# Patient Record
Sex: Female | Born: 1975
Health system: Southern US, Community
[De-identification: ages and names within clinical notes are randomized; demographics above are authoritative.]

## PROBLEM LIST (undated history)

## (undated) DIAGNOSIS — E559 Vitamin D deficiency, unspecified: Secondary | ICD-10-CM

## (undated) DIAGNOSIS — F419 Anxiety disorder, unspecified: Secondary | ICD-10-CM

## (undated) HISTORY — DX: Vitamin D deficiency, unspecified: E55.9

## (undated) HISTORY — PX: WISDOM TOOTH EXTRACTION: SHX21

---

## 2001-10-01 ENCOUNTER — Other Ambulatory Visit: Admission: RE | Admit: 2001-10-01 | Discharge: 2001-10-01 | Payer: Self-pay | Admitting: Obstetrics and Gynecology

## 2001-10-23 ENCOUNTER — Other Ambulatory Visit: Admission: RE | Admit: 2001-10-23 | Discharge: 2001-10-23 | Payer: Self-pay | Admitting: Obstetrics and Gynecology

## 2011-03-16 ENCOUNTER — Ambulatory Visit: Payer: Self-pay | Admitting: Internal Medicine

## 2016-07-13 ENCOUNTER — Encounter: Payer: Self-pay | Admitting: Physician Assistant

## 2016-12-28 ENCOUNTER — Encounter: Payer: Self-pay | Admitting: Physician Assistant

## 2016-12-28 ENCOUNTER — Ambulatory Visit (INDEPENDENT_AMBULATORY_CARE_PROVIDER_SITE_OTHER): Payer: BLUE CROSS/BLUE SHIELD | Admitting: Physician Assistant

## 2016-12-28 VITALS — BP 125/87 | HR 88 | Temp 98.1°F | Ht 65.0 in | Wt 157.6 lb

## 2016-12-28 DIAGNOSIS — R6889 Other general symptoms and signs: Secondary | ICD-10-CM | POA: Diagnosis not present

## 2016-12-28 DIAGNOSIS — L659 Nonscarring hair loss, unspecified: Secondary | ICD-10-CM

## 2016-12-28 DIAGNOSIS — F411 Generalized anxiety disorder: Secondary | ICD-10-CM | POA: Diagnosis not present

## 2016-12-28 DIAGNOSIS — R05 Cough: Secondary | ICD-10-CM | POA: Diagnosis not present

## 2016-12-28 DIAGNOSIS — R059 Cough, unspecified: Secondary | ICD-10-CM

## 2016-12-28 DIAGNOSIS — R5383 Other fatigue: Secondary | ICD-10-CM | POA: Diagnosis not present

## 2016-12-28 MED ORDER — AZITHROMYCIN 250 MG PO TABS: ORAL_TABLET | ORAL | 0 refills | Status: DC

## 2016-12-28 MED ORDER — LORAZEPAM 0.5 MG PO TABS: 0.5000 mg | ORAL_TABLET | Freq: Two times a day (BID) | ORAL | 2 refills | Status: DC

## 2016-12-28 NOTE — Patient Instructions (Signed)
Generalized Anxiety Disorder Generalized anxiety disorder (GAD) is a mental disorder. It interferes with life functions, including relationships, work, and school. GAD is different from normal anxiety, which everyone experiences at some point in their lives in response to specific life events and activities. Normal anxiety actually helps us prepare for and get through these life events and activities. Normal anxiety goes away after the event or activity is over.  GAD causes anxiety that is not necessarily related to specific events or activities. It also causes excess anxiety in proportion to specific events or activities. The anxiety associated with GAD is also difficult to control. GAD can vary from mild to severe. People with severe GAD can have intense waves of anxiety with physical symptoms (panic attacks).  SYMPTOMS The anxiety and worry associated with GAD are difficult to control. This anxiety and worry are related to many life events and activities and also occur more days than not for 6 months or longer. People with GAD also have three or more of the following symptoms (one or more in children):  Restlessness.   Fatigue.  Difficulty concentrating.   Irritability.  Muscle tension.  Difficulty sleeping or unsatisfying sleep. DIAGNOSIS GAD is diagnosed through an assessment by your health care provider. Your health care provider will ask you questions aboutyour mood,physical symptoms, and events in your life. Your health care provider may ask you about your medical history and use of alcohol or drugs, including prescription medicines. Your health care provider may also do a physical exam and blood tests. Certain medical conditions and the use of certain substances can cause symptoms similar to those associated with GAD. Your health care provider may refer you to a mental health specialist for further evaluation. TREATMENT The following therapies are usually used to treat GAD:    Medication. Antidepressant medication usually is prescribed for long-term daily control. Antianxiety medicines may be added in severe cases, especially when panic attacks occur.   Talk therapy (psychotherapy). Certain types of talk therapy can be helpful in treating GAD by providing support, education, and guidance. A form of talk therapy called cognitive behavioral therapy can teach you healthy ways to think about and react to daily life events and activities.  Stress managementtechniques. These include yoga, meditation, and exercise and can be very helpful when they are practiced regularly. A mental health specialist can help determine which treatment is best for you. Some people see improvement with one therapy. However, other people require a combination of therapies. This information is not intended to replace advice given to you by your health care provider. Make sure you discuss any questions you have with your health care provider. Document Released: 03/30/2013 Document Revised: 12/24/2014 Document Reviewed: 03/30/2013 Elsevier Interactive Patient Education  2017 Elsevier Inc.  

## 2016-12-29 LAB — THYROID PANEL WITH TSH
FREE THYROXINE INDEX: 1.5 (ref 1.2–4.9)
T3 Uptake Ratio: 26 % (ref 24–39)
T4, Total: 5.9 ug/dL (ref 4.5–12.0)
TSH: 2.13 u[IU]/mL (ref 0.450–4.500)

## 2016-12-29 LAB — CBC WITH DIFFERENTIAL/PLATELET
BASOS ABS: 0.1 10*3/uL (ref 0.0–0.2)
Basos: 1 %
EOS (ABSOLUTE): 0.2 10*3/uL (ref 0.0–0.4)
Eos: 4 %
HEMOGLOBIN: 12.7 g/dL (ref 11.1–15.9)
Hematocrit: 38.7 % (ref 34.0–46.6)
IMMATURE GRANULOCYTES: 0 %
Immature Grans (Abs): 0 10*3/uL (ref 0.0–0.1)
LYMPHS ABS: 2 10*3/uL (ref 0.7–3.1)
Lymphs: 31 %
MCH: 28.9 pg (ref 26.6–33.0)
MCHC: 32.8 g/dL (ref 31.5–35.7)
MCV: 88 fL (ref 79–97)
MONOCYTES: 11 %
Monocytes Absolute: 0.7 10*3/uL (ref 0.1–0.9)
Neutrophils Absolute: 3.4 10*3/uL (ref 1.4–7.0)
Neutrophils: 53 %
Platelets: 372 10*3/uL (ref 150–379)
RBC: 4.4 x10E6/uL (ref 3.77–5.28)
RDW: 13.1 % (ref 12.3–15.4)
WBC: 6.4 10*3/uL (ref 3.4–10.8)

## 2016-12-30 NOTE — Progress Notes (Signed)
BP 125/87   Pulse 88   Temp 98.1 F (36.7 C) (Oral)   Ht 5\' 5"  (1.651 m)   Wt 157 lb 9.6 oz (71.5 kg)   LMP 12/21/2016   BMI 26.23 kg/m    Subjective:    Patient ID: Lisa AcheLori M Hanauer, female    DOB: 1976-04-05, 41 y.o.   MRN: [id number]  Lisa Schneider is a 41 y.o. female presenting on 12/28/2016 for Fatigue; cold all the time; foggy brained; Pressure in ears; Cough; and Fever   HPI  History reviewed. No pertinent past medical history. Relevant past medical, surgical, family and social history reviewed and updated as indicated. Interim medical history since our last visit reviewed. Allergies and medications reviewed and updated.   Data reviewed from any sources in EPIC.  Review of Systems  Constitutional: Positive for chills and fatigue. Negative for activity change, fever and unexpected weight change.  HENT: Positive for congestion, sinus pressure and sore throat.   Eyes: Negative.   Respiratory: Positive for cough. Negative for shortness of breath and wheezing.   Cardiovascular: Negative.  Negative for chest pain and palpitations.  Gastrointestinal: Negative.  Negative for abdominal pain.  Endocrine: Negative.   Genitourinary: Negative.  Negative for dysuria.  Musculoskeletal: Positive for arthralgias and myalgias.  Skin: Negative.   Neurological: Positive for headaches. Negative for dizziness.     Social History   Social History  . Marital status: Married    Spouse name: N/A  . Number of children: N/A  . Years of education: N/A   Occupational History  . Not on file.   Social History Main Topics  . Smoking status: Never Smoker  . Smokeless tobacco: Never Used  . Alcohol use No     Comment: social   . Drug use: No  . Sexual activity: Not on file   Other Topics Concern  . Not on file   Social History Narrative  . No narrative on file    Past Surgical History:  Procedure Laterality Date  . WISDOM TOOTH EXTRACTION      Family History  Problem Relation  Age of Onset  . Thyroid disease Mother   . Thyroid disease Father   . Hyperlipidemia Father   . Hypertension Father   . Heart disease Father   . Cancer Father     Prostate     Allergies as of 12/28/2016      Reactions   Amoxicillin Other (See Comments)   Headache   Ciprofloxacin    Penicillins       Medication List       Accurate as of 12/28/16 11:59 PM. Always use your most recent med list.          azithromycin 250 MG tablet Commonly known as:  ZITHROMAX Z-PAK Take as directed   LORazepam 0.5 MG tablet Commonly known as:  ATIVAN Take 1 tablet (0.5 mg total) by mouth 2 (two) times daily.          Objective:    BP 125/87   Pulse 88   Temp 98.1 F (36.7 C) (Oral)   Ht 5\' 5"  (1.651 m)   Wt 157 lb 9.6 oz (71.5 kg)   LMP 12/21/2016   BMI 26.23 kg/m   Allergies  Allergen Reactions  . Amoxicillin Other (See Comments)    Headache  . Ciprofloxacin   . Penicillins    Wt Readings from Last 3 Encounters:  12/28/16 157 lb 9.6 oz (71.5 kg)  Physical Exam  Constitutional: She is oriented to person, place, and time. She appears well-developed and well-nourished.  HENT:  Head: Normocephalic and atraumatic.  Right Ear: External ear and ear canal normal. A middle ear effusion is present.  Left Ear: External ear and ear canal normal. A middle ear effusion is present.  Nose: Sinus tenderness present. No rhinorrhea.  Mouth/Throat: Mucous membranes are normal. Posterior oropharyngeal erythema present. No oropharyngeal exudate.  Eyes: Conjunctivae and EOM are normal. Pupils are equal, round, and reactive to light.  Neck: Normal range of motion. Neck supple.  Cardiovascular: Normal rate, regular rhythm, normal heart sounds and intact distal pulses.   Pulmonary/Chest: Effort normal and breath sounds normal. No accessory muscle usage. No respiratory distress.  Abdominal: Soft. Bowel sounds are normal.  Neurological: She is alert and oriented to person, place, and time.  She has normal reflexes.  Skin: Skin is warm and dry. No rash noted.  Psychiatric: She has a normal mood and affect. Her behavior is normal. Judgment and thought content normal.    Results for orders placed or performed in visit on 12/28/16  Thyroid Panel With TSH  Result Value Ref Range   TSH 2.130 0.450 - 4.500 uIU/mL   T4, Total 5.9 4.5 - 12.0 ug/dL   T3 Uptake Ratio 26 24 - 39 %   Free Thyroxine Index 1.5 1.2 - 4.9  CBC with Differential/Platelet  Result Value Ref Range   WBC 6.4 3.4 - 10.8 x10E3/uL   RBC 4.40 3.77 - 5.28 x10E6/uL   Hemoglobin 12.7 11.1 - 15.9 g/dL   Hematocrit 16.1 09.6 - 46.6 %   MCV 88 79 - 97 fL   MCH 28.9 26.6 - 33.0 pg   MCHC 32.8 31.5 - 35.7 g/dL   RDW 04.5 40.9 - 81.1 %   Platelets 372 150 - 379 x10E3/uL   Neutrophils 53 Not Estab. %   Lymphs 31 Not Estab. %   Monocytes 11 Not Estab. %   Eos 4 Not Estab. %   Basos 1 Not Estab. %   Neutrophils Absolute 3.4 1.4 - 7.0 x10E3/uL   Lymphocytes Absolute 2.0 0.7 - 3.1 x10E3/uL   Monocytes Absolute 0.7 0.1 - 0.9 x10E3/uL   EOS (ABSOLUTE) 0.2 0.0 - 0.4 x10E3/uL   Basophils Absolute 0.1 0.0 - 0.2 x10E3/uL   Immature Granulocytes 0 Not Estab. %   Immature Grans (Abs) 0.0 0.0 - 0.1 x10E3/uL      Assessment & Plan:   1. Fatigue, unspecified type - Thyroid Panel With TSH - CBC with Differential/Platelet  2. Generalized anxiety disorder - LORazepam (ATIVAN) 0.5 MG tablet; Take 1 tablet (0.5 mg total) by mouth 2 (two) times daily.  Dispense: 60 tablet; Refill: 2  3. Cold intolerance - Thyroid Panel With TSH - CBC with Differential/Platelet  4. Hair loss - Thyroid Panel With TSH  5. Cough - azithromycin (ZITHROMAX Z-PAK) 250 MG tablet; Take as directed  Dispense: 6 each; Refill: 0   Continue all other maintenance medications as listed above. Educational handout given for generalized anxiety disorder  Follow up plan: Return in about 6 months (around 06/27/2017), or if symptoms worsen or fail to  improve.  Remus Loffler PA-C [facility] [address]  [place], [place] [address] [phone]   12/30/2016, 6:41 PM

## 2017-01-01 ENCOUNTER — Encounter: Payer: Self-pay | Admitting: Physician Assistant

## 2017-01-03 ENCOUNTER — Telehealth: Payer: Self-pay | Admitting: Physician Assistant

## 2017-01-03 MED ORDER — VITAMIN D (ERGOCALCIFEROL) 1.25 MG (50000 UNIT) PO CAPS: 50000.0000 [IU] | ORAL_CAPSULE | ORAL | 11 refills | Status: DC

## 2017-01-03 NOTE — Telephone Encounter (Signed)
Prescription sent to pharmacy.

## 2017-04-03 ENCOUNTER — Ambulatory Visit (INDEPENDENT_AMBULATORY_CARE_PROVIDER_SITE_OTHER): Payer: BLUE CROSS/BLUE SHIELD | Admitting: Physician Assistant

## 2017-04-03 ENCOUNTER — Encounter: Payer: Self-pay | Admitting: Physician Assistant

## 2017-04-03 VITALS — BP 122/87 | HR 67 | Temp 97.4°F | Ht 65.0 in | Wt 156.8 lb

## 2017-04-03 DIAGNOSIS — R059 Cough, unspecified: Secondary | ICD-10-CM

## 2017-04-03 DIAGNOSIS — J301 Allergic rhinitis due to pollen: Secondary | ICD-10-CM

## 2017-04-03 DIAGNOSIS — R05 Cough: Secondary | ICD-10-CM

## 2017-04-03 DIAGNOSIS — J0110 Acute frontal sinusitis, unspecified: Secondary | ICD-10-CM | POA: Diagnosis not present

## 2017-04-03 MED ORDER — FLUTICASONE PROPIONATE 50 MCG/ACT NA SUSP: 2.0000 | Freq: Every day | NASAL | 6 refills | Status: DC

## 2017-04-03 MED ORDER — LORATADINE 10 MG PO TABS: 10.0000 mg | ORAL_TABLET | Freq: Every day | ORAL | 11 refills | Status: DC

## 2017-04-03 MED ORDER — AZITHROMYCIN 250 MG PO TABS: ORAL_TABLET | ORAL | 0 refills | Status: DC

## 2017-04-03 NOTE — Progress Notes (Signed)
BP 122/87   Pulse 67   Temp 97.4 F (36.3 C) (Oral)   Ht  (1.651 m)   Wt 156 lb 12.8 oz (71.1 kg)   BMI 26.09 kg/m    Subjective:    Patient ID: Lisa Schneider, female    DOB: 1976/08/22, 41 y.o.   MRN: [id number]  HPI: Lisa Schneider is a 41 y.o. female presenting on 04/03/2017 for Cough; Nasal Congestion; Sore Throat; and Ears stopped up  This patient has had many days of sinus headache and postnasal drainage. There is copious drainage at times. Denies any fever at this time. There has been a history of sinus infections in the past.  No history of sinus surgery. There is cough at night. It has become more prevalent in recent days.  Relevant past medical, surgical, family and social history reviewed and updated as indicated. Allergies and medications reviewed and updated.  History reviewed. No pertinent past medical history.  Past Surgical History:  Procedure Laterality Date  . WISDOM TOOTH EXTRACTION      Review of Systems  Constitutional: Positive for chills and fatigue. Negative for activity change and appetite change.  HENT: Positive for congestion, postnasal drip, rhinorrhea, sinus pain, sinus pressure and sore throat.   Eyes: Negative.   Respiratory: Positive for cough. Negative for shortness of breath and wheezing.   Cardiovascular: Negative.  Negative for chest pain, palpitations and leg swelling.  Gastrointestinal: Negative.   Genitourinary: Negative.   Musculoskeletal: Negative.   Skin: Negative.   Neurological: Positive for headaches.    Allergies as of 04/03/2017      Reactions   Amoxicillin Other (See Comments)   Headache   Ciprofloxacin    Penicillins       Medication List       Accurate as of 04/03/17  9:07 AM. Always use your most recent med list.          azithromycin 250 MG tablet Commonly known as:  ZITHROMAX Z-PAK Take as directed   fluticasone 50 MCG/ACT nasal spray Commonly known as:  FLONASE Place 2 sprays into both nostrils  daily.   loratadine 10 MG tablet Commonly known as:  CLARITIN Take 1 tablet (10 mg total) by mouth daily.   LORazepam 0.5 MG tablet Commonly known as:  ATIVAN Take 1 tablet (0.5 mg total) by mouth 2 (two) times daily.   Vitamin D (Ergocalciferol) 50000 units Caps capsule Commonly known as:  DRISDOL Take 1 capsule (50,000 Units total) by mouth every 7 (seven) days.          Objective:    BP 122/87   Pulse 67   Temp 97.4 F (36.3 C) (Oral)   Ht  (1.651 m)   Wt 156 lb 12.8 oz (71.1 kg)   BMI 26.09 kg/m   Allergies  Allergen Reactions  . Amoxicillin Other (See Comments)    Headache  . Ciprofloxacin   . Penicillins     Physical Exam  Constitutional: She is oriented to person, place, and time. She appears well-developed and well-nourished.  HENT:  Head: Normocephalic and atraumatic.  Right Ear: Tympanic membrane and external ear normal. No middle ear effusion.  Left Ear: Tympanic membrane and external ear normal.  No middle ear effusion.  Nose: Mucosal edema and rhinorrhea present. Right sinus exhibits no maxillary sinus tenderness. Left sinus exhibits no maxillary sinus tenderness.  Mouth/Throat: Uvula is midline. Posterior oropharyngeal erythema present.  Eyes: Conjunctivae and EOM are normal. Pupils are  equal, round, and reactive to light. Right eye exhibits no discharge. Left eye exhibits no discharge.  Neck: Normal range of motion.  Cardiovascular: Normal rate, regular rhythm and normal heart sounds.   Pulmonary/Chest: Effort normal and breath sounds normal. No respiratory distress. She has no wheezes.  Abdominal: Soft.  Lymphadenopathy:    She has no cervical adenopathy.  Neurological: She is alert and oriented to person, place, and time.  Skin: Skin is warm and dry.  Psychiatric: She has a normal mood and affect.  Nursing note and vitals reviewed.       Assessment & Plan:   1. Cough - azithromycin (ZITHROMAX Z-PAK) 250 MG tablet; Take as directed   Dispense: 6 each; Refill: 0  2. Acute non-recurrent frontal sinusitis  3. Allergic rhinitis due to pollen, unspecified seasonality - loratadine (CLARITIN) 10 MG tablet; Take 1 tablet (10 mg total) by mouth daily.  Dispense: 30 tablet; Refill: 11 - fluticasone (FLONASE) 50 MCG/ACT nasal spray; Place 2 sprays into both nostrils daily.  Dispense: 16 g; Refill: 6   Current Outpatient Prescriptions:  .  LORazepam (ATIVAN) 0.5 MG tablet, Take 1 tablet (0.5 mg total) by mouth 2 (two) times daily., Disp: 60 tablet, Rfl: 2 .  Vitamin D, Ergocalciferol, (DRISDOL) 50000 units CAPS capsule, Take 1 capsule (50,000 Units total) by mouth every 7 (seven) days., Disp: 4 capsule, Rfl: 11 .  azithromycin (ZITHROMAX Z-PAK) 250 MG tablet, Take as directed, Disp: 6 each, Rfl: 0 .  fluticasone (FLONASE) 50 MCG/ACT nasal spray, Place 2 sprays into both nostrils daily., Disp: 16 g, Rfl: 6 .  loratadine (CLARITIN) 10 MG tablet, Take 1 tablet (10 mg total) by mouth daily., Disp: 30 tablet, Rfl: 11  Continue all other maintenance medications as listed above.  Follow up plan: Return if symptoms worsen or fail to improve.  Educational handout given for allergic rhintis  Remus Loffler PA-C [facility] [address]  [place], [place] [address] [phone]   04/03/2017, 9:07 AM

## 2017-04-03 NOTE — Patient Instructions (Signed)
Allergic Rhinitis Allergic rhinitis is when the mucous membranes in the nose respond to allergens. Allergens are particles in the air that cause your body to have an allergic reaction. This causes you to release allergic antibodies. Through a chain of events, these eventually cause you to release histamine into the blood stream. Although meant to protect the body, it is this release of histamine that causes your discomfort, such as frequent sneezing, congestion, and an itchy, runny nose. What are the causes? Seasonal allergic rhinitis (hay fever) is caused by pollen allergens that may come from grasses, trees, and weeds. Year-round allergic rhinitis (perennial allergic rhinitis) is caused by allergens such as house dust mites, pet dander, and mold spores. What are the signs or symptoms?  Nasal stuffiness (congestion).  Itchy, runny nose with sneezing and tearing of the eyes. How is this diagnosed? Your health care provider can help you determine the allergen or allergens that trigger your symptoms. If you and your health care provider are unable to determine the allergen, skin or blood testing may be used. Your health care provider will diagnose your condition after taking your health history and performing a physical exam. Your health care provider may assess you for other related conditions, such as asthma, pink eye, or an ear infection. How is this treated? Allergic rhinitis does not have a cure, but it can be controlled by:  Medicines that block allergy symptoms. These may include allergy shots, nasal sprays, and oral antihistamines.  Avoiding the allergen. Hay fever may often be treated with antihistamines in pill or nasal spray forms. Antihistamines block the effects of histamine. There are over-the-counter medicines that may help with nasal congestion and swelling around the eyes. Check with your health care provider before taking or giving this medicine. If avoiding the allergen or the  medicine prescribed do not work, there are many new medicines your health care provider can prescribe. Stronger medicine may be used if initial measures are ineffective. Desensitizing injections can be used if medicine and avoidance does not work. Desensitization is when a patient is given ongoing shots until the body becomes less sensitive to the allergen. Make sure you follow up with your health care provider if problems continue. Follow these instructions at home: It is not possible to completely avoid allergens, but you can reduce your symptoms by taking steps to limit your exposure to them. It helps to know exactly what you are allergic to so that you can avoid your specific triggers. Contact a health care provider if:  You have a fever.  You develop a cough that does not stop easily (persistent).  You have shortness of breath.  You start wheezing.  Symptoms interfere with normal daily activities. This information is not intended to replace advice given to you by your health care provider. Make sure you discuss any questions you have with your health care provider. Document Released: 08/28/2001 Document Revised: 08/03/2016 Document Reviewed: 08/10/2013 Elsevier Interactive Patient Education  2017 Elsevier Inc.  

## 2017-04-04 ENCOUNTER — Ambulatory Visit: Payer: BLUE CROSS/BLUE SHIELD | Admitting: Physician Assistant

## 2017-04-30 ENCOUNTER — Encounter: Payer: Self-pay | Admitting: Physician Assistant

## 2017-04-30 ENCOUNTER — Other Ambulatory Visit: Payer: Self-pay | Admitting: Physician Assistant

## 2017-04-30 MED ORDER — DOXYCYCLINE HYCLATE 100 MG PO TABS
100.0000 mg | ORAL_TABLET | Freq: Two times a day (BID) | ORAL | 0 refills | Status: DC
Start: 2017-04-30 — End: 2017-04-30

## 2017-04-30 MED ORDER — AMOXICILLIN-POT CLAVULANATE 875-125 MG PO TABS
1.0000 | ORAL_TABLET | Freq: Two times a day (BID) | ORAL | 0 refills | Status: DC
Start: 2017-04-30 — End: 2017-06-28

## 2017-06-28 ENCOUNTER — Encounter: Payer: Self-pay | Admitting: Physician Assistant

## 2017-06-28 ENCOUNTER — Ambulatory Visit (INDEPENDENT_AMBULATORY_CARE_PROVIDER_SITE_OTHER): Payer: 59 | Admitting: Physician Assistant

## 2017-06-28 VITALS — BP 124/87 | HR 77 | Temp 98.9°F | Ht 65.0 in | Wt 159.0 lb

## 2017-06-28 DIAGNOSIS — Z0000 Encounter for general adult medical examination without abnormal findings: Secondary | ICD-10-CM

## 2017-06-28 DIAGNOSIS — E559 Vitamin D deficiency, unspecified: Secondary | ICD-10-CM | POA: Insufficient documentation

## 2017-06-28 DIAGNOSIS — R5383 Other fatigue: Secondary | ICD-10-CM

## 2017-06-28 DIAGNOSIS — R42 Dizziness and giddiness: Secondary | ICD-10-CM | POA: Diagnosis not present

## 2017-06-28 NOTE — Patient Instructions (Signed)
Lisa Schneider

## 2017-07-01 ENCOUNTER — Encounter: Payer: Self-pay | Admitting: Physician Assistant

## 2017-07-01 DIAGNOSIS — E559 Vitamin D deficiency, unspecified: Secondary | ICD-10-CM | POA: Diagnosis not present

## 2017-07-01 DIAGNOSIS — R5383 Other fatigue: Secondary | ICD-10-CM | POA: Diagnosis not present

## 2017-07-01 DIAGNOSIS — Z0000 Encounter for general adult medical examination without abnormal findings: Secondary | ICD-10-CM | POA: Diagnosis not present

## 2017-07-01 NOTE — Progress Notes (Signed)
BP 124/87   Pulse 77   Temp 98.9 F (37.2 C) (Oral)   Ht 5' 5"  (1.651 m)   Wt 159 lb (72.1 kg)   BMI 26.46 kg/m    Subjective:    Patient ID: Lisa Schneider, female    DOB: November 13, 1976, 42 y.o.   MRN: [id number]  HPI: Lisa Schneider is a 41 y.o. female presenting on 06/28/2017 for Follow-up (6 month ); Fatigue; and Dizziness  This patient comes in for periodic recheck on medications and conditions including vitamin D deficiency, GERD. Also with fatigue and poor endurance in normal activities. GERD is controlled with Zantac. Has some upper pain and bloating, discussed taking zantac 300 mg daily. No known exposure to lyme and RMSF. Marland Kitchen   All medications are reviewed today. There are no reports of any problems with the medications. All of the medical conditions are reviewed and updated.  Lab work is reviewed and will be ordered as medically necessary. There are no new problems reported with today's visit.   Relevant past medical, surgical, family and social history reviewed and updated as indicated. Allergies and medications reviewed and updated.  Past Medical History:  Diagnosis Date  . Vitamin D deficiency     Past Surgical History:  Procedure Laterality Date  . WISDOM TOOTH EXTRACTION      Review of Systems  Constitutional: Positive for fatigue. Negative for activity change, chills and fever.  HENT: Negative.   Eyes: Negative.   Respiratory: Negative.  Negative for cough.   Cardiovascular: Negative.  Negative for chest pain.  Gastrointestinal: Positive for abdominal distention and abdominal pain.  Endocrine: Negative.   Genitourinary: Negative.  Negative for dysuria.  Musculoskeletal: Negative.   Skin: Negative.   Neurological: Positive for dizziness. Negative for weakness and headaches.    Allergies as of 06/28/2017      Reactions   Amoxicillin Other (See Comments)   Headache   Ciprofloxacin    Penicillins       Medication List       Accurate as of 06/28/17 11:59  PM. Always use your most recent med list.          fluticasone 50 MCG/ACT nasal spray Commonly known as:  FLONASE Place 2 sprays into both nostrils daily.   loratadine 10 MG tablet Commonly known as:  CLARITIN Take 1 tablet (10 mg total) by mouth daily.   LORazepam 0.5 MG tablet Commonly known as:  ATIVAN Take 1 tablet (0.5 mg total) by mouth 2 (two) times daily.   ranitidine 150 MG tablet Commonly known as:  ZANTAC Take 150 mg by mouth 2 (two) times daily.   Vitamin D (Ergocalciferol) 50000 units Caps capsule Commonly known as:  DRISDOL Take 1 capsule (50,000 Units total) by mouth every 7 (seven) days.          Objective:    BP 124/87   Pulse 77   Temp 98.9 F (37.2 C) (Oral)   Ht 5' 5"  (1.651 m)   Wt 159 lb (72.1 kg)   BMI 26.46 kg/m   Allergies  Allergen Reactions  . Amoxicillin Other (See Comments)    Headache  . Ciprofloxacin   . Penicillins     Physical Exam  Constitutional: She is oriented to person, place, and time. She appears well-developed and well-nourished.  HENT:  Head: Normocephalic and atraumatic.  Right Ear: Tympanic membrane, external ear and ear canal normal.  Left Ear: Tympanic membrane, external ear and ear canal normal.  Nose: Nose normal. No rhinorrhea.  Mouth/Throat: Oropharynx is clear and moist and mucous membranes are normal. No oropharyngeal exudate or posterior oropharyngeal erythema.  Eyes: Pupils are equal, round, and reactive to light. Conjunctivae and EOM are normal.  Neck: Normal range of motion. Neck supple.  Cardiovascular: Normal rate, regular rhythm, normal heart sounds and intact distal pulses.   Pulmonary/Chest: Effort normal and breath sounds normal.  Abdominal: Soft. Bowel sounds are normal.  Neurological: She is alert and oriented to person, place, and time. She has normal reflexes.  Skin: Skin is warm and dry. No rash noted.  Psychiatric: She has a normal mood and affect. Her behavior is normal. Judgment and  thought content normal.    Results for orders placed or performed in visit on 12/28/16  Thyroid Panel With TSH  Result Value Ref Range   TSH 2.130 0.450 - 4.500 uIU/mL   T4, Total 5.9 4.5 - 12.0 ug/dL   T3 Uptake Ratio 26 24 - 39 %   Free Thyroxine Index 1.5 1.2 - 4.9  CBC with Differential/Platelet  Result Value Ref Range   WBC 6.4 3.4 - 10.8 x10E3/uL   RBC 4.40 3.77 - 5.28 x10E6/uL   Hemoglobin 12.7 11.1 - 15.9 g/dL   Hematocrit 38.7 34.0 - 46.6 %   MCV 88 79 - 97 fL   MCH 28.9 26.6 - 33.0 pg   MCHC 32.8 31.5 - 35.7 g/dL   RDW 13.1 12.3 - 15.4 %   Platelets 372 150 - 379 x10E3/uL   Neutrophils 53 Not Estab. %   Lymphs 31 Not Estab. %   Monocytes 11 Not Estab. %   Eos 4 Not Estab. %   Basos 1 Not Estab. %   Neutrophils Absolute 3.4 1.4 - 7.0 x10E3/uL   Lymphocytes Absolute 2.0 0.7 - 3.1 x10E3/uL   Monocytes Absolute 0.7 0.1 - 0.9 x10E3/uL   EOS (ABSOLUTE) 0.2 0.0 - 0.4 x10E3/uL   Basophils Absolute 0.1 0.0 - 0.2 x10E3/uL   Immature Granulocytes 0 Not Estab. %   Immature Grans (Abs) 0.0 0.0 - 0.1 x10E3/uL      Assessment & Plan:   1. Vitamin D deficiency - VITAMIN D 25 Hydroxy (Vit-D Deficiency, Fractures)  2. Fatigue, unspecified type - CBC with Differential/Platelet - CMP14+EGFR - Thyroid Panel With TSH - Anemia Profile B  3. Dizziness - CBC with Differential/Platelet - CMP14+EGFR - Thyroid Panel With TSH - Anemia Profile B  4. Well adult exam - CBC with Differential/Platelet - CMP14+EGFR - Lipid panel - Thyroid Panel With TSH - Anemia Profile B   Continue all other maintenance medications as listed above.  Follow up plan: Return in about 4 weeks (around 07/26/2017) for recheck.  Educational handout given for [place] PA-C [facility] [address]  [place], [place] [address] [phone]   07/01/2017, 7:36 PM

## 2017-07-08 ENCOUNTER — Encounter: Payer: Self-pay | Admitting: Physician Assistant

## 2017-08-16 ENCOUNTER — Ambulatory Visit: Payer: 59 | Admitting: Physician Assistant

## 2017-11-15 DIAGNOSIS — H1045 Other chronic allergic conjunctivitis: Secondary | ICD-10-CM | POA: Diagnosis not present

## 2017-12-11 ENCOUNTER — Other Ambulatory Visit: Payer: Self-pay | Admitting: Physician Assistant

## 2017-12-11 NOTE — Telephone Encounter (Signed)
Last seen 7./13/18   Lisa Schneider  [facility] approved [facility]

## 2018-01-03 DIAGNOSIS — Z1231 Encounter for screening mammogram for malignant neoplasm of breast: Secondary | ICD-10-CM | POA: Diagnosis not present

## 2018-01-25 ENCOUNTER — Other Ambulatory Visit: Payer: Self-pay | Admitting: Physician Assistant

## 2018-02-05 DIAGNOSIS — N920 Excessive and frequent menstruation with regular cycle: Secondary | ICD-10-CM | POA: Diagnosis not present

## 2018-02-25 DIAGNOSIS — N924 Excessive bleeding in the premenopausal period: Secondary | ICD-10-CM | POA: Diagnosis not present

## 2018-04-24 ENCOUNTER — Other Ambulatory Visit: Payer: Self-pay | Admitting: Physician Assistant

## 2018-04-24 ENCOUNTER — Encounter: Payer: Self-pay | Admitting: Physician Assistant

## 2018-04-24 MED ORDER — AMOXICILLIN-POT CLAVULANATE 875-125 MG PO TABS: 1.0000 | ORAL_TABLET | Freq: Two times a day (BID) | ORAL | 0 refills | Status: DC

## 2018-05-07 DIAGNOSIS — N920 Excessive and frequent menstruation with regular cycle: Secondary | ICD-10-CM | POA: Diagnosis not present

## 2018-05-11 DIAGNOSIS — R000 Tachycardia, unspecified: Secondary | ICD-10-CM | POA: Diagnosis not present

## 2018-05-11 DIAGNOSIS — E876 Hypokalemia: Secondary | ICD-10-CM | POA: Diagnosis not present

## 2018-05-11 DIAGNOSIS — K529 Noninfective gastroenteritis and colitis, unspecified: Secondary | ICD-10-CM | POA: Diagnosis not present

## 2018-05-19 ENCOUNTER — Ambulatory Visit (INDEPENDENT_AMBULATORY_CARE_PROVIDER_SITE_OTHER): Payer: 59 | Admitting: Physician Assistant

## 2018-05-19 ENCOUNTER — Encounter: Payer: Self-pay | Admitting: Physician Assistant

## 2018-05-19 VITALS — BP 121/87 | HR 89 | Temp 99.1°F | Ht 65.0 in | Wt 157.8 lb

## 2018-05-19 DIAGNOSIS — J301 Allergic rhinitis due to pollen: Secondary | ICD-10-CM

## 2018-05-19 DIAGNOSIS — R42 Dizziness and giddiness: Secondary | ICD-10-CM

## 2018-05-19 MED ORDER — FLUTICASONE PROPIONATE 50 MCG/ACT NA SUSP: 2.0000 | Freq: Every day | NASAL | 6 refills | Status: DC

## 2018-05-19 MED ORDER — MECLIZINE HCL 25 MG PO TABS: 25.0000 mg | ORAL_TABLET | Freq: Three times a day (TID) | ORAL | 0 refills | Status: DC | PRN

## 2018-05-19 MED ORDER — LORATADINE 10 MG PO TABS: 10.0000 mg | ORAL_TABLET | Freq: Every day | ORAL | 11 refills | Status: DC

## 2018-05-20 NOTE — Progress Notes (Signed)
BP 121/87   Pulse 89   Temp 99.1 F (37.3 C) (Oral)   Ht 5\' 5"  (1.651 m)   Wt 157 lb 12.8 oz (71.6 kg)   BMI 26.26 kg/m    Subjective:    Patient ID: Lisa Schneider, female    DOB: January 29, 1976, 42 y.o.   MRN: [id number]  HPI: Lisa Schneider is a 42 y.o. female presenting on 05/19/2018 for Dizziness (x 1 month ) and Fatigue  This patient comes in after having dizziness and fatigue over the past week.  The dizziness increases whenever she moves her head.  She is experienced some nausea but denies any vomiting.  She denies any fever or chills.  She does have allergies and congestion that go under the spring.  She has not been using Flonase lately.  She does feel tight through her ears.  Past Medical History:  Diagnosis Date  . Vitamin D deficiency    Relevant past medical, surgical, family and social history reviewed and updated as indicated. Interim medical history since our last visit reviewed. Allergies and medications reviewed and updated. DATA REVIEWED: CHART IN EPIC  Family History reviewed for pertinent findings.  Review of Systems  Constitutional: Positive for fatigue. Negative for activity change, appetite change, chills and fever.  HENT: Positive for congestion, postnasal drip, sore throat and tinnitus.   Eyes: Negative.   Respiratory: Negative for cough, shortness of breath and wheezing.   Cardiovascular: Negative.  Negative for chest pain, palpitations and leg swelling.  Gastrointestinal: Negative.   Genitourinary: Negative.   Musculoskeletal: Negative.   Skin: Negative.   Neurological: Positive for dizziness and headaches.    Allergies as of 05/19/2018      Reactions   Amoxicillin Other (See Comments)   Headache   Ciprofloxacin    Penicillins       Medication List        Accurate as of 05/19/18 11:59 PM. Always use your most recent med list.          fluticasone 50 MCG/ACT nasal spray Commonly known as:  FLONASE Place 2 sprays into both nostrils  daily.   loratadine 10 MG tablet Commonly known as:  CLARITIN Take 1 tablet (10 mg total) by mouth daily.   LORazepam 0.5 MG tablet Commonly known as:  ATIVAN Take 1 tablet (0.5 mg total) by mouth 2 (two) times daily.   LORazepam 0.5 MG tablet Commonly known as:  ATIVAN TAKE  (1)  TABLET TWICE A DAY.   meclizine 25 MG tablet Commonly known as:  ANTIVERT Take 1 tablet (25 mg total) by mouth 3 (three) times daily as needed for dizziness.   NUVARING 0.12-0.015 MG/24HR vaginal ring Generic drug:  etonogestrel-ethinyl estradiol   ranitidine 150 MG tablet Commonly known as:  ZANTAC Take 150 mg by mouth 2 (two) times daily.   Vitamin D (Ergocalciferol) 50000 units Caps capsule Commonly known as:  DRISDOL Take 1 capsule (50,000 Units total) by mouth every 7 (seven) days.          Objective:    BP 121/87   Pulse 89   Temp 99.1 F (37.3 C) (Oral)   Ht 5\' 5"  (1.651 m)   Wt 157 lb 12.8 oz (71.6 kg)   BMI 26.26 kg/m   Allergies  Allergen Reactions  . Amoxicillin Other (See Comments)    Headache  . Ciprofloxacin   . Penicillins     Wt Readings from Last 3 Encounters:  05/19/18 157  lb 12.8 oz (71.6 kg)  06/28/17 159 lb (72.1 kg)  04/03/17 156 lb 12.8 oz (71.1 kg)    Physical Exam  Constitutional: She is oriented to person, place, and time. She appears well-developed and well-nourished.  HENT:  Head: Normocephalic and atraumatic.  Eyes: Pupils are equal, round, and reactive to light. Conjunctivae and EOM are normal.  Cardiovascular: Normal rate, regular rhythm, normal heart sounds and intact distal pulses.  Pulmonary/Chest: Effort normal and breath sounds normal.  Abdominal: Soft. Bowel sounds are normal.  Neurological: She is alert and oriented to person, place, and time. She has normal reflexes. Coordination abnormal.  Positive Phalen's  Skin: Skin is warm and dry. No rash noted.  Psychiatric: She has a normal mood and affect. Her behavior is normal. Judgment and  thought content normal.        Assessment & Plan:   1. Allergic rhinitis due to pollen, unspecified seasonality - loratadine (CLARITIN) 10 MG tablet; Take 1 tablet (10 mg total) by mouth daily.  Dispense: 30 tablet; Refill: 11 - fluticasone (FLONASE) 50 MCG/ACT nasal spray; Place 2 sprays into both nostrils daily.  Dispense: 16 g; Refill: 6  2. Vertigo - meclizine (ANTIVERT) 25 MG tablet; Take 1 tablet (25 mg total) by mouth 3 (three) times daily as needed for dizziness.  Dispense: 40 tablet; Refill: 0   Continue all other maintenance medications as listed above.  Follow up plan: No follow-ups on file.  Educational handout given for vertigo  Remus LofflerAngel S. Hindy Perrault PA-C [facility] [address]  [place], [place] [address] [phone]   05/20/2018, 1:24 PM

## 2018-07-18 DIAGNOSIS — N920 Excessive and frequent menstruation with regular cycle: Secondary | ICD-10-CM | POA: Diagnosis not present

## 2018-07-18 DIAGNOSIS — R5383 Other fatigue: Secondary | ICD-10-CM | POA: Diagnosis not present

## 2018-07-22 ENCOUNTER — Ambulatory Visit (INDEPENDENT_AMBULATORY_CARE_PROVIDER_SITE_OTHER): Payer: 59 | Admitting: Physician Assistant

## 2018-07-22 ENCOUNTER — Encounter: Payer: Self-pay | Admitting: Physician Assistant

## 2018-07-22 VITALS — BP 122/82 | HR 72 | Temp 98.2°F | Ht 65.0 in | Wt 156.4 lb

## 2018-07-22 DIAGNOSIS — S83421A Sprain of lateral collateral ligament of right knee, initial encounter: Secondary | ICD-10-CM | POA: Diagnosis not present

## 2018-07-22 NOTE — Patient Instructions (Signed)

## 2018-07-22 NOTE — Progress Notes (Signed)
BP 122/82   Pulse 72   Temp 98.2 F (36.8 C) (Oral)   Ht 5\' 5"  (1.651 m)   Wt 156 lb 6.4 oz (70.9 kg)   BMI 26.03 kg/m    Subjective:    Patient ID: Lisa Schneider, female    DOB: Jul 18, 1976, 42 y.o.   MRN: [id number]  HPI: Lisa Schneider is a 42 y.o. female presenting on 07/22/2018 for Knee Pain (right )  More than a month ago she fell onto her knee with it bent underneath her.  She has the greatest amount pain on the lateral portion of the knee.  She has a significant amount of swelling in the initial days.  She states that it did tend to get better.  She took a little bit of ibuprofen.  In a few weeks ago she was jumping and felt the exact same pain on the lateral portion of the knee.  She is starting to have pain in other joints because she is compensating her stride.  Past Medical History:  Diagnosis Date  . Vitamin D deficiency    Relevant past medical, surgical, family and social history reviewed and updated as indicated. Interim medical history since our last visit reviewed. Allergies and medications reviewed and updated. DATA REVIEWED: CHART IN EPIC  Family History reviewed for pertinent findings.  Review of Systems  Constitutional: Negative.   HENT: Negative.   Eyes: Negative.   Respiratory: Negative.   Gastrointestinal: Negative.   Genitourinary: Negative.   Musculoskeletal: Positive for arthralgias and joint swelling.    Allergies as of 07/22/2018      Reactions   Amoxicillin Other (See Comments)   Headache   Ciprofloxacin    Penicillins       Medication List        Accurate as of 07/22/18  1:24 PM. Always use your most recent med list.          fluticasone 50 MCG/ACT nasal spray Commonly known as:  FLONASE Place 2 sprays into both nostrils daily.   loratadine 10 MG tablet Commonly known as:  CLARITIN Take 1 tablet (10 mg total) by mouth daily.   LORazepam 0.5 MG tablet Commonly known as:  ATIVAN Take 1 tablet (0.5 mg total) by mouth 2 (two) times  daily.   LORazepam 0.5 MG tablet Commonly known as:  ATIVAN TAKE  (1)  TABLET TWICE A DAY.   meclizine 25 MG tablet Commonly known as:  ANTIVERT Take 1 tablet (25 mg total) by mouth 3 (three) times daily as needed for dizziness.   ranitidine 150 MG tablet Commonly known as:  ZANTAC Take 150 mg by mouth 2 (two) times daily.   Vitamin D (Ergocalciferol) 50000 units Caps capsule Commonly known as:  DRISDOL Take 1 capsule (50,000 Units total) by mouth every 7 (seven) days.          Objective:    BP 122/82   Pulse 72   Temp 98.2 F (36.8 C) (Oral)   Ht 5\' 5"  (1.651 m)   Wt 156 lb 6.4 oz (70.9 kg)   BMI 26.03 kg/m   Allergies  Allergen Reactions  . Amoxicillin Other (See Comments)    Headache  . Ciprofloxacin   . Penicillins     Wt Readings from Last 3 Encounters:  07/22/18 156 lb 6.4 oz (70.9 kg)  05/19/18 157 lb 12.8 oz (71.6 kg)  06/28/17 159 lb (72.1 kg)    Physical Exam  Constitutional: She is oriented to  person, place, and time. She appears well-developed and well-nourished.  HENT:  Head: Normocephalic and atraumatic.  Eyes: Pupils are equal, round, and reactive to light. Conjunctivae and EOM are normal.  Cardiovascular: Normal rate, regular rhythm, normal heart sounds and intact distal pulses.  Pulmonary/Chest: Effort normal and breath sounds normal.  Abdominal: Soft. Bowel sounds are normal.  Musculoskeletal:       Right knee: She exhibits swelling. She exhibits normal range of motion, no effusion, no deformity, no erythema, no LCL laxity and normal patellar mobility. Tenderness found. Lateral joint line and LCL tenderness noted.       Legs: Neurological: She is alert and oriented to person, place, and time. She has normal reflexes.  Skin: Skin is warm and dry. No rash noted.  Psychiatric: She has a normal mood and affect. Her behavior is normal. Judgment and thought content normal.        Assessment & Plan:   1. Tear of lateral collateral ligament  of right knee, initial encounter - Ambulatory referral to Physical Therapy   Continue all other maintenance medications as listed above.  Follow up plan: No follow-ups on file.  Educational handout given for survey  Remus LofflerAngel S. Shoaib Siefker PA-C [facility] [address]  [place], [place] [address] [phone]   07/22/2018, 1:24 PM

## 2018-08-05 ENCOUNTER — Encounter: Payer: Self-pay | Admitting: Physical Therapy

## 2018-08-05 ENCOUNTER — Ambulatory Visit: Payer: 59 | Attending: Physician Assistant | Admitting: Physical Therapy

## 2018-08-05 ENCOUNTER — Other Ambulatory Visit: Payer: Self-pay

## 2018-08-05 DIAGNOSIS — M25561 Pain in right knee: Secondary | ICD-10-CM | POA: Diagnosis present

## 2018-08-05 DIAGNOSIS — M6281 Muscle weakness (generalized): Secondary | ICD-10-CM | POA: Insufficient documentation

## 2018-08-05 DIAGNOSIS — M25661 Stiffness of right knee, not elsewhere classified: Secondary | ICD-10-CM | POA: Insufficient documentation

## 2018-08-05 DIAGNOSIS — G8929 Other chronic pain: Secondary | ICD-10-CM | POA: Diagnosis not present

## 2018-08-05 NOTE — Therapy (Signed)
[facility] [address] [place], [place], [address] Phone: [phone]   Fax:  [phone]  Physical Therapy Evaluation  Patient Details  Name: ROMANDA TURRUBIATES MRN: [id number] Date of Birth: [birth year] Referring Provider: Prudy Feeler, PA-C   Encounter Date: 08/05/2018  PT End of Session - 08/05/18 2208    Visit Number  1    Number of Visits  8    Date for PT Re-Evaluation  09/09/18    PT Start Time  0815    PT Stop Time  0855    PT Time Calculation (min)  40 min    Activity Tolerance  Patient tolerated treatment well    Behavior During Therapy  [facility] for tasks assessed/performed       Past Medical History:  Diagnosis Date  . Vitamin D deficiency     Past Surgical History:  Procedure Laterality Date  . WISDOM TOOTH EXTRACTION      There were no vitals filed for this visit.   Subjective Assessment - 08/05/18 2143    Subjective  Patient arrives to physical therapy with lateral right knee pain after a fall in mixed martial arts in April 2019. Patient unsure of the mechanism of the fall but reported her sparring partner fell onto her lap. Patient reports knee feels "weak" and has difficulties with sitting for extended periods of time and straightening her knee fully. Patient reports pain at worst with those activities is 5/10 but reports intermittent shooting 8/10 pain of unknown cause. Pain at best is 0/10 with rest. Patient's goals are to improve movement, improve strength, and return to recreational activities and fitness classes.    Limitations  Sitting;House hold activities    Diagnostic tests  none    Patient Stated Goals  return to fitness classes like spin/cycling    Currently in Pain?  Yes    Pain Score  2     Pain Location  Knee    Pain Orientation  Lateral    Pain Descriptors / Indicators  Sore;Tiring    Pain Type  Chronic pain    Pain Onset  More than a month ago    Pain Frequency  Intermittent    Aggravating Factors    straightening knee, sitting for too long, sitting in figure 4 position    Pain Relieving Factors  not extending fully; rest    Effect of Pain on Daily Activities  none         [facility] PT Assessment - 08/05/18 0001      Assessment   Medical Diagnosis  Tear of lateral collateral ligament of right knee    Referring Provider  Prudy Feeler, PA-C    Onset Date/Surgical Date  --   Late April 2019   Next MD Visit  N/A    Prior Therapy  No      Balance Screen   Has the patient fallen in the past 6 months  No    Has the patient had a decrease in activity level because of a fear of falling?   No    Is the patient reluctant to leave their home because of a fear of falling?   No      Home Environment   Living Environment  Private residence    Living Arrangements  Spouse/significant other;Children   with 1 dog     Prior Function   Level of Independence  Independent    Vocation  Full time employment  Observation/Other Assessments   Focus on Therapeutic Outcomes (FOTO)   49% limited      Sensation   Light Touch  Appears Intact      ROM / Strength   AROM / PROM / Strength  AROM;Strength      AROM   Overall AROM   Due to pain    AROM Assessment Site  Knee    Right/Left Knee  Right    Right Knee Extension  5    Right Knee Flexion  130      Strength   Overall Strength  Within functional limits for tasks performed    Strength Assessment Site  Knee;Hip    Right/Left Hip  Right    Right Hip Flexion  4-/5    Right Hip Extension  4+/5    Right Hip ABduction  4-/5    Right/Left Knee  Right    Right Knee Flexion  4+/5    Right Knee Extension  4+/5      Palpation   Patella mobility  decreased right knee patella mobility in all planes 2/6      Special Tests    Special Tests  Laxity/Instability Tests;Knee Special Tests    Laxity/Instability   other;Anterior drawer test      Anterior drawer test   Findings  Negative    Side  Right      Other   Findings  Negative    side   Right    comment  Negative Varus test at 0 and 30 degrees      Ambulation/Gait   Gait Pattern  Step-through pattern;Decreased stance time - right;Decreased hip/knee flexion - right      Balance   Balance Assessed  Yes    Balance comment  (-) romberg and sharpened romberg                Objective measurements completed on examination: See above findings.              PT Education - 08/05/18 2207    Education Details  quad sets, hamstring isometrics, clam shells, hip abduction    Person(s) Educated  Patient    Methods  Explanation;Demonstration;Handout    Comprehension  Returned demonstration;Verbalized understanding          PT Long Term Goals - 08/05/18 2157      PT LONG TERM GOAL #1   Title  Patient will be independent with HEP and progression    Time  4    Period  Weeks    Status  New      PT LONG TERM GOAL #2   Title  Patient will demonstrate 0 degrees of right knee flexion to improve gait.    Time  4    Period  Weeks    Status  New      PT LONG TERM GOAL #3   Title  Patient will demonstrate 5/5 right knee MMT in all planes to improve knee stability during functional tasks    Time  4    Period  Weeks    Status  New      PT LONG TERM GOAL #4   Title  Patient will demonstrate 4+/5 or greater right hip abduction MMT to improve knee stability during functional tasks.    Time  4    Period  Weeks    Status  New      PT LONG TERM GOAL #5   Title  Patient will return to  group fitness classes and exercising with less than 2/10 pain in right lateral knee.    Time  4    Period  Weeks    Status  New             Plan - 08/05/18 2200    Clinical Impression Statement  Patient is a 42 year old female who presents to physical therapy with increased right lateral knee pain, decreased AROM and decreased knee and hip strength. Patient (-) for varus test for LCL sprain. Patient (-) Ober's test. Patient denies tenderness to palpation along lateral  aspect of the knee and denies numbness or tingling. Patient noted with decreased patella mobility in all planes in comparison to unaffected L knee. Patient ambulates with decreased stance time on right but otherwise noted with normal gait pattern. Patient noted with good balance with minimal sway with Romberg and sharpened Romberg. Patient would benefit from skilled physical therapy to address deficits and address patient's goals.     Clinical Presentation  Stable    Clinical Decision Making  Low    Rehab Potential  Excellent    PT Frequency  2x / week    PT Duration  4 weeks    PT Treatment/Interventions  ADLs/Self Care Home Management;Electrical Stimulation;Cryotherapy;Iontophoresis 4mg /ml Dexamethasone;Moist Heat;Ultrasound;Therapeutic exercise;Therapeutic activities;Gait training;Stair training;Neuromuscular re-education;Passive range of motion;Manual techniques;Patient/family education;Taping;Vasopneumatic Device    PT Next Visit Plan  bike or nustep, pain free quad strengthening, STW/M and patella mobility, modalities PRN for pain relief.    PT Home Exercise Plan  Quad sets, hamstring isometrics, clam shell, hip abduction    Consulted and Agree with Plan of Care  Patient       Patient will benefit from skilled therapeutic intervention in order to improve the following deficits and impairments:  Pain, Decreased activity tolerance, Decreased strength, Decreased range of motion, Difficulty walking  Visit Diagnosis: Chronic pain of right knee  Stiffness of right knee, not elsewhere classified  Muscle weakness (generalized)     Problem List Patient Active Problem List   Diagnosis Date Noted  . Vitamin D deficiency 06/28/2017  . Allergic rhinitis due to pollen 04/03/2017  . Fatigue 12/28/2016  . Generalized anxiety disorder 12/28/2016  . Cold intolerance 12/28/2016  . Hair loss 12/28/2016  . Cough 12/28/2016   Guss BundeKrystle Kalasia Crafton, PT, DPT 08/05/2018, 10:09 PM  [facility] [address] [place], [place], [address] Phone: [phone]   Fax:  [phone]  Name: Raechel AcheLori M Acheampong MRN: [id number] Date of Birth: 09-05-1976

## 2018-08-07 ENCOUNTER — Encounter: Payer: Self-pay | Admitting: Physical Therapy

## 2018-08-07 ENCOUNTER — Ambulatory Visit: Payer: 59 | Admitting: Physical Therapy

## 2018-08-07 DIAGNOSIS — M25561 Pain in right knee: Secondary | ICD-10-CM | POA: Diagnosis not present

## 2018-08-07 DIAGNOSIS — G8929 Other chronic pain: Secondary | ICD-10-CM

## 2018-08-07 DIAGNOSIS — M6281 Muscle weakness (generalized): Secondary | ICD-10-CM

## 2018-08-07 DIAGNOSIS — M25661 Stiffness of right knee, not elsewhere classified: Secondary | ICD-10-CM

## 2018-08-07 NOTE — Therapy (Signed)
[facility] [place], [place], [address] Phone: [phone]   Fax:  [phone]  Physical Therapy Treatment  Patient Details  Name: Lisa Schneider MRN: [id number] Date of Birth: 1976/11/28 Referring Provider: Particia Nearing, PA-C   Encounter Date: 08/07/2018  PT End of Session - 08/07/18 0818    Visit Number  2    Number of Visits  8    Date for PT Re-Evaluation  09/09/18    PT Start Time  0730    PT Stop Time  0829    PT Time Calculation (min)  59 min    Activity Tolerance  Patient tolerated treatment well    Behavior During Therapy  [facility] for tasks assessed/performed       Past Medical History:  Diagnosis Date  . Vitamin D deficiency     Past Surgical History:  Procedure Laterality Date  . WISDOM TOOTH EXTRACTION      There were no vitals filed for this visit.  Subjective Assessment - 08/07/18 0735    Subjective  Patient arrived with minor pain, pain increases with certain movements.    Limitations  Sitting;House hold activities    Diagnostic tests  none    Patient Stated Goals  return to fitness classes like spin/cycling    Currently in Pain?  Yes    Pain Score  2     Pain Location  Knee    Pain Orientation  Lateral    Pain Descriptors / Indicators  Sore;Discomfort    Pain Onset  More than a month ago    Pain Frequency  Intermittent    Aggravating Factors   Any one position too long or certain movements    Pain Relieving Factors  at rest                       [facility] Adult PT Treatment/Exercise - 08/07/18 0001      Exercises   Exercises  Knee/Hip      Knee/Hip Exercises: Aerobic   Nustep  34mn L4 UE/LE      Knee/Hip Exercises: Standing   Terminal Knee Extension  Strengthening;Right;20 reps;10 reps    Theraband Level (Terminal Knee Extension)  Other (comment)    Terminal Knee Extension Limitations  pink XTS     Lateral Step Up  20 reps;Right;Step Height: 6"    Forward Step Up  20  reps;Right;Step Height: 6"    Rocker Board  2 minutes      Knee/Hip Exercises: Seated   Long Arc Quad  Strengthening;Right;3 sets;10 reps;Weights    Long Arc Quad Weight  4 lbs.      Knee/Hip Exercises: Supine   Short Arc Quad Sets  Strengthening;Right;3 sets;10 reps    Short Arc Quad Sets Limitations  4#    Bridges with [facility] Strengthening;Both;15 reps    Straight Leg Raises  Strengthening;Right;20 reps    Straight Leg Raise with External Rotation  Strengthening;20 reps;Right    Other Supine Knee/Hip Exercises  clam Rt LE ret t-band x30      Modalities   Modalities  Electrical Stimulation;Vasopneumatic      Electrical Stimulation   Electrical Stimulation Location  right knee    Electrical Stimulation Action  --    Electrical Stimulation Parameters  --    Electrical Stimulation Goals  Other (comment)   attempted although patient did not like the ES     Vasopneumatic   Number Minutes Vasopneumatic  15 minutes    Vasopnuematic Location   Knee    Vasopneumatic Pressure  Medium      Manual Therapy   Manual Therapy  Soft tissue mobilization;Passive ROM    Soft tissue mobilization  patella mobs in all directions to improve mobility    Passive ROM  gentle ext overpressure with holds to improve ROM                  PT Long Term Goals - 08/07/18 0742      PT LONG TERM GOAL #1   Title  Patient will be independent with HEP and progression    Time  4    Period  Weeks    Status  On-going      PT LONG TERM GOAL #2   Title  Patient will demonstrate 0 degrees of right knee flexion to improve gait.    Time  4    Period  Weeks    Status  On-going      PT LONG TERM GOAL #3   Title  Patient will demonstrate 5/5 right knee MMT in all planes to improve knee stability during functional tasks    Time  4    Period  Weeks    Status  On-going      PT LONG TERM GOAL #4   Title  Patient will demonstrate 4+/5 or greater right hip abduction MMT to improve knee stability  during functional tasks.    Time  4    Period  Weeks    Status  On-going      PT LONG TERM GOAL #5   Title  Patient will return to group fitness classes and exercising with less than 2/10 pain in right lateral knee.    Time  4    Period  Weeks    Status  On-going            Plan - 08/07/18 9811    Clinical Impression Statement  Patient tolerated treatment well today. Patient had mild pain throughout yet no increased reported with exercises. Patient has some tightness in right patella and limitations with ext. Patient goals ongoing at this time. Normal response to modalities upon removal.    Rehab Potential  Excellent    PT Frequency  2x / week    PT Duration  4 weeks    PT Treatment/Interventions  ADLs/Self Care Home Management;Electrical Stimulation;Cryotherapy;Iontophoresis 75m/ml Dexamethasone;Moist Heat;Ultrasound;Therapeutic exercise;Therapeutic activities;Gait training;Stair training;Neuromuscular re-education;Passive range of motion;Manual techniques;Patient/family education;Taping;Vasopneumatic Device    PT Next Visit Plan  bike or nustep, pain free quad strengthening, STW/M and patella mobility, VASO PRN for pain relief.    Consulted and Agree with Plan of Care  Patient       Patient will benefit from skilled therapeutic intervention in order to improve the following deficits and impairments:  Pain, Decreased activity tolerance, Decreased strength, Decreased range of motion, Difficulty walking  Visit Diagnosis: Chronic pain of right knee  Stiffness of right knee, not elsewhere classified  Muscle weakness (generalized)     Problem List Patient Active Problem List   Diagnosis Date Noted  . Vitamin D deficiency 06/28/2017  . Allergic rhinitis due to pollen 04/03/2017  . Fatigue 12/28/2016  . Generalized anxiety disorder 12/28/2016  . Cold intolerance 12/28/2016  . Hair loss 12/28/2016  . Cough 12/28/2016    Dalilah Curlin P, PTA 08/07/2018, 8:34  AM  [facility] [address]Phone: [phone]  Fax:  [phone]  Name: SHALEENA CRUSOE MRN: [id number] Date of Birth: 11-20-1976

## 2018-08-12 ENCOUNTER — Encounter: Payer: Self-pay | Admitting: Physical Therapy

## 2018-08-12 ENCOUNTER — Ambulatory Visit: Payer: 59 | Admitting: Physical Therapy

## 2018-08-12 DIAGNOSIS — G8929 Other chronic pain: Secondary | ICD-10-CM

## 2018-08-12 DIAGNOSIS — M6281 Muscle weakness (generalized): Secondary | ICD-10-CM

## 2018-08-12 DIAGNOSIS — M25561 Pain in right knee: Principal | ICD-10-CM

## 2018-08-12 DIAGNOSIS — M25661 Stiffness of right knee, not elsewhere classified: Secondary | ICD-10-CM

## 2018-08-12 NOTE — Therapy (Signed)
[facility] [address] [place], [place], [address] Phone: [phone]   Fax:  [phone]  Physical Therapy Treatment  Patient Details  Name: Lisa Schneider MRN: [id number] Date of Birth: 1976-06-25 Referring Provider: Prudy Feeler, PA-C   Encounter Date: 08/12/2018  PT End of Session - 08/12/18 0738    Visit Number  3    Number of Visits  8    Date for PT Re-Evaluation  09/09/18    PT Start Time  0730    PT Stop Time  0822    PT Time Calculation (min)  52 min    Activity Tolerance  Patient tolerated treatment well    Behavior During Therapy  [facility] for tasks assessed/performed       Past Medical History:  Diagnosis Date  . Vitamin D deficiency     Past Surgical History:  Procedure Laterality Date  . WISDOM TOOTH EXTRACTION      There were no vitals filed for this visit.  Subjective Assessment - 08/12/18 0737    Subjective  Patient reports feeling "about the same" with minimal pain.    Limitations  Sitting;House hold activities    Diagnostic tests  none    Patient Stated Goals  return to fitness classes like spin/cycling    Currently in Pain?  Yes    Pain Score  2     Pain Orientation  Lateral    Pain Descriptors / Indicators  Discomfort;Sore    Pain Type  Chronic pain    Pain Onset  More than a month ago    Pain Frequency  Intermittent         [facility] PT Assessment - 08/12/18 0001      Assessment   Medical Diagnosis  Tear of lateral collateral ligament of right knee                   [facility] Adult PT Treatment/Exercise - 08/12/18 0001      Exercises   Exercises  Knee/Hip      Knee/Hip Exercises: Aerobic   Nustep  15 min L4 UE/LE      Knee/Hip Exercises: Standing   Terminal Knee Extension  Strengthening;Right;20 reps    Theraband Level (Terminal Knee Extension)  Other (comment)    Terminal Knee Extension Limitations  pink XTS     Rocker Board  2 minutes    Other Standing Knee Exercises  lateral  stepping with red Tband in tiled hall way x3       Knee/Hip Exercises: Seated   Long Arc Quad  Strengthening;Right;2 sets;10 reps;Weights   2" hold   Long Arc Quad Weight  4 lbs.      Modalities   Modalities  Vasopneumatic      Vasopneumatic   Number Minutes Vasopneumatic   15 minutes    Vasopnuematic Location   Knee    Vasopneumatic Pressure  Low    Vasopneumatic Temperature   34                  PT Long Term Goals - 08/07/18 0742      PT LONG TERM GOAL #1   Title  Patient will be independent with HEP and progression    Time  4    Period  Weeks    Status  On-going      PT LONG TERM GOAL #2   Title  Patient will demonstrate 0 degrees of right knee flexion to improve gait.  Time  4    Period  Weeks    Status  On-going      PT LONG TERM GOAL #3   Title  Patient will demonstrate 5/5 right knee MMT in all planes to improve knee stability during functional tasks    Time  4    Period  Weeks    Status  On-going      PT LONG TERM GOAL #4   Title  Patient will demonstrate 4+/5 or greater right hip abduction MMT to improve knee stability during functional tasks.    Time  4    Period  Weeks    Status  On-going      PT LONG TERM GOAL #5   Title  Patient will return to group fitness classes and exercising with less than 2/10 pain in right lateral knee.    Time  4    Period  Weeks    Status  On-going            Plan - 08/12/18 0815    Clinical Impression Statement  Patient was able to tolerate treatment well with no reports of increased pain. Patient demonstrated good form with all exercises and demonstrated good knee to toe alignment with stationary lunges. Patient provided with added HEP to optimize PT and patient educated again importance of performing HEP. Patient reported understanding. No adverse affects noted upon removal of vasopneumatic device.     Clinical Presentation  Stable    Clinical Decision Making  Low    Rehab Potential  Excellent    PT  Frequency  2x / week    PT Duration  4 weeks    PT Treatment/Interventions  ADLs/Self Care Home Management;Electrical Stimulation;Cryotherapy;Iontophoresis 4mg /ml Dexamethasone;Moist Heat;Ultrasound;Therapeutic exercise;Therapeutic activities;Gait training;Stair training;Neuromuscular re-education;Passive range of motion;Manual techniques;Patient/family education;Taping;Vasopneumatic Device    PT Next Visit Plan  bike or nustep, pain free quad strengthening, STW/M and patella mobility, VASO PRN for pain relief.    PT Home Exercise Plan  SLR, SLR with ER, gastroc stretch, soleus stretch    Consulted and Agree with Plan of Care  Patient       Patient will benefit from skilled therapeutic intervention in order to improve the following deficits and impairments:  Pain, Decreased activity tolerance, Decreased strength, Decreased range of motion, Difficulty walking  Visit Diagnosis: Chronic pain of right knee  Stiffness of right knee, not elsewhere classified  Muscle weakness (generalized)     Problem List Patient Active Problem List   Diagnosis Date Noted  . Vitamin D deficiency 06/28/2017  . Allergic rhinitis due to pollen 04/03/2017  . Fatigue 12/28/2016  . Generalized anxiety disorder 12/28/2016  . Cold intolerance 12/28/2016  . Hair loss 12/28/2016  . Cough 12/28/2016    Guss BundeKrystle Benjie Ricketson, PT, DPT 08/12/2018, 8:27 AM  [facility] [address] [place], [place], [address] Phone: [phone]   Fax:  [phone]  Name: Lisa AcheLori M Schneider MRN: [id number] Date of Birth: 30-May-1976

## 2018-08-14 ENCOUNTER — Encounter: Payer: Self-pay | Admitting: Physical Therapy

## 2018-08-14 ENCOUNTER — Ambulatory Visit: Payer: 59 | Admitting: Physical Therapy

## 2018-08-14 DIAGNOSIS — M25661 Stiffness of right knee, not elsewhere classified: Secondary | ICD-10-CM

## 2018-08-14 DIAGNOSIS — M25561 Pain in right knee: Principal | ICD-10-CM

## 2018-08-14 DIAGNOSIS — G8929 Other chronic pain: Secondary | ICD-10-CM

## 2018-08-14 DIAGNOSIS — M6281 Muscle weakness (generalized): Secondary | ICD-10-CM

## 2018-08-14 NOTE — Therapy (Signed)
[facility] [place], [place], [address] Phone: [phone]   Fax:  [phone]  Physical Therapy Treatment  Patient Details  Name: Lisa Schneider MRN: [id number] Date of Birth: 28-Nov-1976 Referring Provider: Particia Nearing, PA-C   Encounter Date: 08/14/2018  PT End of Session - 08/14/18 0815    Visit Number  4    Number of Visits  8    Date for PT Re-Evaluation  09/09/18    PT Start Time  0730    PT Stop Time  0827    PT Time Calculation (min)  57 min    Activity Tolerance  Patient tolerated treatment well    Behavior During Therapy  [facility] for tasks assessed/performed       Past Medical History:  Diagnosis Date  . Vitamin D deficiency     Past Surgical History:  Procedure Laterality Date  . WISDOM TOOTH EXTRACTION      There were no vitals filed for this visit.  Subjective Assessment - 08/14/18 0739    Subjective  Patient reports feeling "about the same" with minimal pain.    Limitations  Sitting;House hold activities    Diagnostic tests  none    Patient Stated Goals  return to fitness classes like spin/cycling    Currently in Pain?  Yes    Pain Score  2     Pain Location  Knee    Pain Orientation  Lateral    Pain Descriptors / Indicators  Discomfort    Pain Type  Chronic pain    Pain Onset  More than a month ago    Pain Frequency  Intermittent    Aggravating Factors   any one position too long    Pain Relieving Factors  at rest                       [facility] Adult PT Treatment/Exercise - 08/14/18 0001      Knee/Hip Exercises: Aerobic   Nustep  15 min L4 UE/LE      Knee/Hip Exercises: Standing   Terminal Knee Extension  Strengthening;Right;20 reps;10 reps    Theraband Level (Terminal Knee Extension)  Other (comment)    Terminal Knee Extension Limitations  pink XTS     Lateral Step Up  20 reps;Right;Step Height: 6";10 reps    Forward Step Up  20 reps;Right;Step Height: 6";10 reps    Rocker  Board  2 minutes    Other Standing Knee Exercises  lateral stepping with red Tband in tiled hall way x3       Knee/Hip Exercises: Seated   Long Arc Quad  Weights;20 reps;10 reps;Strengthening;Both    Long Arc Quad Weight  4 lbs.      Vasopneumatic   Number Minutes Vasopneumatic   15 minutes    Vasopnuematic Location   Knee    Vasopneumatic Pressure  Low             PT Education - 08/14/18 0819    Education Details  HEP    Person(s) Educated  Patient    Methods  Explanation;Demonstration;Handout    Comprehension  Verbalized understanding;Returned demonstration          PT Long Term Goals - 08/14/18 0820      PT LONG TERM GOAL #1   Title  Patient will be independent with HEP and progression    Time  4    Period  Weeks    Status  Achieved  08/14/18     PT LONG TERM GOAL #2   Title  Patient will demonstrate 0 degrees of right knee flexion to improve gait.    Time  4    Period  Weeks    Status  On-going      PT LONG TERM GOAL #3   Title  Patient will demonstrate 5/5 right knee MMT in all planes to improve knee stability during functional tasks    Time  4    Period  Weeks    Status  On-going      PT LONG TERM GOAL #4   Title  Patient will demonstrate 4+/5 or greater right hip abduction MMT to improve knee stability during functional tasks.    Time  4    Period  Weeks    Status  On-going      PT LONG TERM GOAL #5   Title  Patient will return to group fitness classes and exercising with less than 2/10 pain in right lateral knee.    Time  4    Period  Weeks    Status  On-going   has not returned to exercise class 08/14/18           Plan - 08/14/18 0824    Clinical Impression Statement  Patient tolerated treatment well today. Patient continues to progress with exercises with no increased discomfort. Patient feels the "same" thus far per reported yet no worse. Today issued advanced HEP. Ptient has not returned to her exercises classes at this time. Met LTG  #1 others ongoing.     Rehab Potential  Excellent    PT Frequency  2x / week    PT Duration  4 weeks    PT Treatment/Interventions  ADLs/Self Care Home Management;Electrical Stimulation;Cryotherapy;Iontophoresis 17m/ml Dexamethasone;Moist Heat;Ultrasound;Therapeutic exercise;Therapeutic activities;Gait training;Stair training;Neuromuscular re-education;Passive range of motion;Manual techniques;Patient/family education;Taping;Vasopneumatic Device    PT Next Visit Plan  bike or nustep, pain free quad strengthening, STW/M and patella mobility, VASO PRN for pain relief.    Consulted and Agree with Plan of Care  Patient       Patient will benefit from skilled therapeutic intervention in order to improve the following deficits and impairments:  Pain, Decreased activity tolerance, Decreased strength, Decreased range of motion, Difficulty walking  Visit Diagnosis: Chronic pain of right knee  Stiffness of right knee, not elsewhere classified  Muscle weakness (generalized)     Problem List Patient Active Problem List   Diagnosis Date Noted  . Vitamin D deficiency 06/28/2017  . Allergic rhinitis due to pollen 04/03/2017  . Fatigue 12/28/2016  . Generalized anxiety disorder 12/28/2016  . Cold intolerance 12/28/2016  . Hair loss 12/28/2016  . Cough 12/28/2016    DUNFORD, CHRISTINA P, PTA 08/14/2018, 8:29 AM  [facility] [address]Phone: [phone]  Fax:  [phone] Name: Lisa WILLETTSMRN: [id number] of Birth: [birth year]

## 2018-08-14 NOTE — Patient Instructions (Signed)
  Terminal Knee Extension (Standing)    Facing anchor with right knee slightly bent and tubing just above knee, gently pull knee back straight. Do not overextend knee. Repeat _10___ times per set. Do __1-3__ sets per session. Do _1-2___ sessions per day.      1-2 x dailyHip Hike   Stand on step, right leg off step, knee straight. Raise unsupported hip, keeping knee straight. Repeat _5-10___ times per set. Do __1-2__ sets per session. Do __1-2__ sessions   Step-Down / Step-Up   Stand on stair step or __6__ inch stool. Slowly bend left leg, lowering other foot to floor. Return by straightening front leg. Repeat __10__ times per set. Do __2-3__ sets per session. Do __2-3__ sessions per day.    Knee Extension (Sitting)   Place __0-3__ pound weight on left ankle and straighten knee fully, lower slowly. Repeat _10___ times per set. Do __2-3__ sets per session. Do __2-3__ sessions per day.  Bridging   Slowly raise buttocks from floor, keeping stomach tight. Repeat _10___ times per set. Do __2__ sets per session. Do __2__ sessions per day.

## 2018-08-20 ENCOUNTER — Encounter: Payer: Self-pay | Admitting: Physical Therapy

## 2018-08-20 ENCOUNTER — Ambulatory Visit: Payer: 59 | Attending: Physician Assistant | Admitting: Physical Therapy

## 2018-08-20 DIAGNOSIS — M25661 Stiffness of right knee, not elsewhere classified: Secondary | ICD-10-CM | POA: Diagnosis not present

## 2018-08-20 DIAGNOSIS — M25561 Pain in right knee: Secondary | ICD-10-CM | POA: Insufficient documentation

## 2018-08-20 DIAGNOSIS — G8929 Other chronic pain: Secondary | ICD-10-CM | POA: Insufficient documentation

## 2018-08-20 DIAGNOSIS — M6281 Muscle weakness (generalized): Secondary | ICD-10-CM | POA: Diagnosis present

## 2018-08-20 NOTE — Therapy (Signed)
[facility] [address] [place], [place], [address] Phone: [phone]   Fax:  [phone]  Physical Therapy Treatment  Patient Details  Name: Lisa Schneider MRN: [id number] Date of Birth: 1976-10-05 Referring Provider: Prudy Feeler, PA-C   Encounter Date: 08/20/2018  PT End of Session - 08/20/18 0807    Visit Number  5    Number of Visits  8    PT Start Time  0729    PT Stop Time  0826    PT Time Calculation (min)  57 min    Activity Tolerance  Patient tolerated treatment well    Behavior During Therapy  [facility] for tasks assessed/performed       Past Medical History:  Diagnosis Date  . Vitamin D deficiency     Past Surgical History:  Procedure Laterality Date  . WISDOM TOOTH EXTRACTION      There were no vitals filed for this visit.  Subjective Assessment - 08/20/18 0731    Subjective  Patient arrived with less discomfort and doing overall better    Limitations  Sitting;House hold activities    Diagnostic tests  none    Patient Stated Goals  return to fitness classes like spin/cycling    Currently in Pain?  Yes    Pain Score  1     Pain Location  Knee    Pain Orientation  Lateral    Pain Descriptors / Indicators  Discomfort    Pain Type  Chronic pain    Pain Onset  More than a month ago    Pain Frequency  Intermittent    Aggravating Factors   any one prolong position    Pain Relieving Factors  at rest         [facility] PT Assessment - 08/20/18 0001      ROM / Strength   AROM / PROM / Strength  AROM;PROM      AROM   AROM Assessment Site  Knee    Right/Left Knee  Right;Left    Right Knee Extension  -3      PROM   PROM Assessment Site  Knee    Right/Left Knee  Right    Right Knee Extension  0                   [facility] Adult PT Treatment/Exercise - 08/20/18 0001      Exercises   Exercises  Knee/Hip      Knee/Hip Exercises: Aerobic   Nustep  15 min L5 UE/LE      Knee/Hip Exercises: Standing    Terminal Knee Extension  Strengthening;Right;20 reps;10 reps    Theraband Level (Terminal Knee Extension)  Other (comment)    Terminal Knee Extension Limitations  pink XTS     Lateral Step Up  20 reps;Right;Step Height: 6";10 reps    Forward Step Up  20 reps;Right;Step Height: 6";10 reps    Wall Squat  1 set;10 reps;3 seconds    Rocker Board  2 minutes    Other Standing Knee Exercises  lateral stepping with red Tband in tiled hall way x3       Knee/Hip Exercises: Supine   Bridges with Clamshell  Strengthening;Both;20 reps;Other (comment);Limitations   red t-band     Vasopneumatic   Number Minutes Vasopneumatic   15 minutes    Vasopnuematic Location   Knee    Vasopneumatic Pressure  Low      Manual Therapy   Manual Therapy  Soft tissue  mobilization;Passive ROM    Soft tissue mobilization  patella mobs in all directions to improve mobility    Passive ROM  gentle ext overpressure with holds to improve ROM                  PT Long Term Goals - 08/20/18 0740      PT LONG TERM GOAL #1   Title  Patient will be independent with HEP and progression    Time  4    Status  Achieved      PT LONG TERM GOAL #2   Title  Patient will demonstrate 0 degrees of right knee flexion to improve gait.    Time  4    Period  Weeks    Status  On-going      PT LONG TERM GOAL #3   Title  Patient will demonstrate 5/5 right knee MMT in all planes to improve knee stability during functional tasks    Time  4    Period  Weeks    Status  On-going      PT LONG TERM GOAL #4   Title  Patient will demonstrate 4+/5 or greater right hip abduction MMT to improve knee stability during functional tasks.    Time  4    Period  Weeks    Status  On-going      PT LONG TERM GOAL #5   Title  Patient will return to group fitness classes and exercising with less than 2/10 pain in right lateral knee.    Time  4    Period  Weeks    Status  On-going   has not retuned to class at this time 08/20/18            Plan - 08/20/18 0815    Clinical Impression Statement  Patient tolerated treatment well today. Patient able to perform all exercises with no discomfort. Patient has reported less pain overall. Patient has reported difficulty with squatting and putting pressure on right knee. Patient doing better with ADL's and prolong walking. Goals progressing at this time.      Rehab Potential  Excellent    PT Frequency  2x / week    PT Duration  4 weeks    PT Treatment/Interventions  ADLs/Self Care Home Management;Electrical Stimulation;Cryotherapy;Iontophoresis 4mg /ml Dexamethasone;Moist Heat;Ultrasound;Therapeutic exercise;Therapeutic activities;Gait training;Stair training;Neuromuscular re-education;Passive range of motion;Manual techniques;Patient/family education;Taping;Vasopneumatic Device    PT Next Visit Plan  cont with POC for pain free quad strengthening, STW/M and patella mobility, VASO PRN for pain relief.    Consulted and Agree with Plan of Care  Patient       Patient will benefit from skilled therapeutic intervention in order to improve the following deficits and impairments:  Pain, Decreased activity tolerance, Decreased strength, Decreased range of motion, Difficulty walking  Visit Diagnosis: Chronic pain of right knee  Stiffness of right knee, not elsewhere classified  Muscle weakness (generalized)     Problem List Patient Active Problem List   Diagnosis Date Noted  . Vitamin D deficiency 06/28/2017  . Allergic rhinitis due to pollen 04/03/2017  . Fatigue 12/28/2016  . Generalized anxiety disorder 12/28/2016  . Cold intolerance 12/28/2016  . Hair loss 12/28/2016  . Cough 12/28/2016    Estellar Cadena P, PTA 08/20/2018, 8:32 AM  [facility] [address] [place], [place], [address] Phone: [phone]   Fax:  [phone]  Name: YENIFER DREESEN MRN: [id number] Date of Birth: February 07, 1976

## 2018-08-27 ENCOUNTER — Encounter: Payer: 59 | Admitting: Physical Therapy

## 2018-08-28 ENCOUNTER — Ambulatory Visit: Payer: 59 | Admitting: Physical Therapy

## 2018-08-28 ENCOUNTER — Encounter: Payer: Self-pay | Admitting: Physical Therapy

## 2018-08-28 DIAGNOSIS — G8929 Other chronic pain: Secondary | ICD-10-CM

## 2018-08-28 DIAGNOSIS — M6281 Muscle weakness (generalized): Secondary | ICD-10-CM

## 2018-08-28 DIAGNOSIS — M25561 Pain in right knee: Principal | ICD-10-CM

## 2018-08-28 DIAGNOSIS — M25661 Stiffness of right knee, not elsewhere classified: Secondary | ICD-10-CM

## 2018-08-28 NOTE — Therapy (Signed)
[facility] [place], [place], [address] Phone: [phone]   Fax:  [phone]  Physical Therapy Treatment  Patient Details  Name: Lisa Schneider MRN: [id number] Date of Birth: 31-Oct-1976 Referring Provider: Particia Nearing, PA-C   Encounter Date: 08/28/2018  PT End of Session - 08/28/18 0757    Visit Number  6    Number of Visits  8    Date for PT Re-Evaluation  09/09/18    PT Start Time  0728    PT Stop Time  0826    PT Time Calculation (min)  58 min    Activity Tolerance  Patient tolerated treatment well    Behavior During Therapy  [facility] for tasks assessed/performed       Past Medical History:  Diagnosis Date  . Vitamin D deficiency     Past Surgical History:  Procedure Laterality Date  . WISDOM TOOTH EXTRACTION      There were no vitals filed for this visit.  Subjective Assessment - 08/28/18 0734    Subjective  Patient reported overall improvement, some discomfort due to kicking her leg the wrong way when she was sleeping last night    Limitations  Sitting;House hold activities    Diagnostic tests  none    Patient Stated Goals  return to fitness classes like spin/cycling    Currently in Pain?  Yes    Pain Score  2     Pain Location  Knee    Pain Orientation  Lateral    Pain Descriptors / Indicators  Discomfort    Pain Type  Chronic pain    Pain Onset  More than a month ago    Pain Frequency  Intermittent    Aggravating Factors   kneeling down putting pressure on knee    Pain Relieving Factors  at rest         [facility] PT Assessment - 08/28/18 0001      AROM   AROM Assessment Site  Knee    Right/Left Knee  Right    Right Knee Extension  -2      PROM   PROM Assessment Site  Knee    Right/Left Knee  Right    Right Knee Extension  0                   [facility] Adult PT Treatment/Exercise - 08/28/18 0001      Knee/Hip Exercises: Aerobic   Nustep  15 min L5 UE/LE      Knee/Hip Exercises:  Standing   Terminal Knee Extension  Strengthening;Right;20 reps;10 reps    Theraband Level (Terminal Knee Extension)  Other (comment)    Terminal Knee Extension Limitations  pink XTS     Lateral Step Up  20 reps;Right;Step Height: 6";10 reps    Forward Step Up  20 reps;Right;Step Height: 6";10 reps    Rocker Board  2 minutes    Other Standing Knee Exercises  lateral stepping with red Tband in tiled hall way x3       Knee/Hip Exercises: Supine   Bridges with Clamshell  Strengthening;Both;20 reps;Other (comment);Limitations   RED T-BAND     Vasopneumatic   Number Minutes Vasopneumatic   15 minutes    Vasopnuematic Location   Knee    Vasopneumatic Pressure  Medium      Manual Therapy   Manual Therapy  Soft tissue mobilization;Passive ROM    Soft tissue mobilization  patella mobs in all directions to improve  mobility    Passive ROM  gentle ext overpressure with holds to improve ROM                  PT Long Term Goals - 08/28/18 0757      PT LONG TERM GOAL #1   Title  Patient will be independent with HEP and progression    Time  4    Period  Weeks    Status  Achieved      PT LONG TERM GOAL #2   Title  Patient will demonstrate 0 degrees of right knee flexion to improve gait.    Time  4    Period  Weeks    Status  On-going   AROM -2 degrees 08/28/18     PT LONG TERM GOAL #3   Title  Patient will demonstrate 5/5 right knee MMT in all planes to improve knee stability during functional tasks    Time  4    Period  Weeks    Status  On-going      PT LONG TERM GOAL #4   Title  Patient will demonstrate 4+/5 or greater right hip abduction MMT to improve knee stability during functional tasks.    Time  4    Period  Weeks    Status  On-going      PT LONG TERM GOAL #5   Title  Patient will return to group fitness classes and exercising with less than 2/10 pain in right lateral knee.    Time  4    Period  Weeks    Status  Achieved   08/28/18 back to classes and no more  than 8/93 with certain movements           Plan - 08/28/18 0810    Clinical Impression Statement  Patient tolerated treatment well today. Patient has continued to improve overall and is back to her exercising class with little to no discomfort. Patient has improved with active ext ROM today. Patient has met LTG #5 today other goals ongoing.     Rehab Potential  Excellent    PT Frequency  2x / week    PT Duration  4 weeks    PT Treatment/Interventions  ADLs/Self Care Home Management;Electrical Stimulation;Cryotherapy;Iontophoresis 51m/ml Dexamethasone;Moist Heat;Ultrasound;Therapeutic exercise;Therapeutic activities;Gait training;Stair training;Neuromuscular re-education;Passive range of motion;Manual techniques;Patient/family education;Taping;Vasopneumatic Device    PT Next Visit Plan  cont with POC for pain free quad strengthening, STW/M and patella mobility, VASO PRN for pain relief.    Consulted and Agree with Plan of Care  Patient       Patient will benefit from skilled therapeutic intervention in order to improve the following deficits and impairments:  Pain, Decreased activity tolerance, Decreased strength, Decreased range of motion, Difficulty walking  Visit Diagnosis: Chronic pain of right knee  Stiffness of right knee, not elsewhere classified  Muscle weakness (generalized)     Problem List Patient Active Problem List   Diagnosis Date Noted  . Vitamin D deficiency 06/28/2017  . Allergic rhinitis due to pollen 04/03/2017  . Fatigue 12/28/2016  . Generalized anxiety disorder 12/28/2016  . Cold intolerance 12/28/2016  . Hair loss 12/28/2016  . Cough 12/28/2016    [place] Schneider P, PTA 08/28/2018, 8:26 AM  [facility]: [phone]  Fax:  [phone] Name: Lisa BAGGETTMRN: [id number] of Birth: [birth year]

## 2018-09-03 ENCOUNTER — Ambulatory Visit: Payer: 59 | Admitting: Physical Therapy

## 2018-09-03 ENCOUNTER — Encounter: Payer: Self-pay | Admitting: Physical Therapy

## 2018-09-03 DIAGNOSIS — M25561 Pain in right knee: Secondary | ICD-10-CM | POA: Diagnosis not present

## 2018-09-03 DIAGNOSIS — G8929 Other chronic pain: Secondary | ICD-10-CM

## 2018-09-03 DIAGNOSIS — M6281 Muscle weakness (generalized): Secondary | ICD-10-CM

## 2018-09-03 DIAGNOSIS — M25661 Stiffness of right knee, not elsewhere classified: Secondary | ICD-10-CM

## 2018-09-03 NOTE — Therapy (Signed)
[facility] [place], [place], [address] Phone: [phone]   Fax:  [phone]  Physical Therapy Treatment  Patient Details  Name: Lisa Schneider MRN: [id number] Date of Birth: 14-Sep-1976 Referring Provider: Particia Nearing, PA-C   Encounter Date: 09/03/2018  PT End of Session - 09/03/18 0801    Visit Number  7    Number of Visits  8    Date for PT Re-Evaluation  09/09/18    PT Start Time  0729    PT Stop Time  0827    PT Time Calculation (min)  58 min    Activity Tolerance  Patient tolerated treatment well    Behavior During Therapy  [facility] for tasks assessed/performed       Past Medical History:  Diagnosis Date  . Vitamin D deficiency     Past Surgical History:  Procedure Laterality Date  . WISDOM TOOTH EXTRACTION      There were no vitals filed for this visit.  Subjective Assessment - 09/03/18 0731    Subjective  Patient arrived with reports of improvement and no further pain, doing well with HEP and exercise class    Limitations  Sitting;House hold activities    Diagnostic tests  none    Patient Stated Goals  return to fitness classes like spin/cycling    Currently in Pain?  Yes    Pain Score  1     Pain Location  Knee    Pain Orientation  Right;Lateral    Pain Descriptors / Indicators  Discomfort    Pain Type  Acute pain    Pain Onset  More than a month ago    Pain Frequency  Intermittent    Aggravating Factors   kneeling down and putting pressure on knee    Pain Relieving Factors  at rest         [facility] PT Assessment - 09/03/18 0001      AROM   AROM Assessment Site  Knee    Right/Left Knee  Right    Right Knee Extension  0                   [facility] Adult PT Treatment/Exercise - 09/03/18 0001      Exercises   Exercises  Knee/Hip      Knee/Hip Exercises: Aerobic   Nustep  15 min L5 UE/LE      Knee/Hip Exercises: Standing   Terminal Knee Extension  Strengthening;Right;20 reps;10 reps     Theraband Level (Terminal Knee Extension)  Other (comment)    Terminal Knee Extension Limitations  pink XTS     Lateral Step Up  20 reps;Right;10 reps;Step Height: 8"    Forward Step Up  20 reps;Right;10 reps;Step Height: 8"    Rocker Board  2 minutes    Other Standing Knee Exercises  lateral stepping with red Tband in tiled hall way x3       Knee/Hip Exercises: Seated   Long Arc Quad  Weights;20 reps;10 reps;Strengthening;Right    Long Arc Quad Weight  4 lbs.      Knee/Hip Exercises: Supine   Bridges with Clamshell  Strengthening;Both;20 reps;Other (comment);Limitations   Red t-band     Vasopneumatic   Number Minutes Vasopneumatic   15 minutes    Vasopnuematic Location   Knee    Vasopneumatic Pressure  Medium      Manual Therapy   Manual Therapy  Soft tissue mobilization;Passive ROM    Soft tissue mobilization  patella mobs in all directions to improve mobility    Passive ROM  gentle ext overpressure with holds to improve ROM                  PT Long Term Goals - 09/03/18 4818      PT LONG TERM GOAL #1   Title  Patient will be independent with HEP and progression    Time  4    Period  Weeks    Status  Achieved      PT LONG TERM GOAL #2   Title  Patient will demonstrate 0 degrees of right knee flexion to improve gait.    Time  4    Period  Weeks    Status  Achieved   09/03/18     PT LONG TERM GOAL #3   Title  Patient will demonstrate 5/5 right knee MMT in all planes to improve knee stability during functional tasks    Time  4    Period  Weeks    Status  On-going      PT LONG TERM GOAL #4   Title  Patient will demonstrate 4+/5 or greater right hip abduction MMT to improve knee stability during functional tasks.    Time  4    Period  Weeks    Status  On-going      PT LONG TERM GOAL #5   Title  Patient will return to group fitness classes and exercising with less than 2/10 pain in right lateral knee.    Time  4    Period  Weeks    Status  Achieved             Plan - 09/03/18 0802    Clinical Impression Statement  Patient tolerated treatment well today. Patient able to progress with exercises today with no increased discomfort. Patient has improved with ext ROM in knee today and met goal, other goals ongoing. Patient has one remaining visit then will DC to HEP.    Rehab Potential  Excellent    PT Frequency  2x / week    PT Duration  4 weeks    PT Treatment/Interventions  ADLs/Self Care Home Management;Electrical Stimulation;Cryotherapy;Iontophoresis 66m/ml Dexamethasone;Moist Heat;Ultrasound;Therapeutic exercise;Therapeutic activities;Gait training;Stair training;Neuromuscular re-education;Passive range of motion;Manual techniques;Patient/family education;Taping;Vasopneumatic Device    PT Next Visit Plan  DC next visit    Consulted and Agree with Plan of Care  Patient       Patient will benefit from skilled therapeutic intervention in order to improve the following deficits and impairments:  Pain, Decreased activity tolerance, Decreased strength, Decreased range of motion, Difficulty walking  Visit Diagnosis: Chronic pain of right knee  Stiffness of right knee, not elsewhere classified  Muscle weakness (generalized)     Problem List Patient Active Problem List   Diagnosis Date Noted  . Vitamin D deficiency 06/28/2017  . Allergic rhinitis due to pollen 04/03/2017  . Fatigue 12/28/2016  . Generalized anxiety disorder 12/28/2016  . Cold intolerance 12/28/2016  . Hair loss 12/28/2016  . Cough 12/28/2016    Rigdon Macomber P, PTA 09/03/2018, 8:29 AM  [facility] [place] [address]Phone: [phone]  Fax:  [phone] Name: Lisa BECHLERMRN: [id number] of Birth: [birth year]

## 2018-09-10 ENCOUNTER — Encounter: Payer: Self-pay | Admitting: Physical Therapy

## 2018-09-10 ENCOUNTER — Ambulatory Visit: Payer: 59 | Admitting: Physical Therapy

## 2018-09-10 DIAGNOSIS — M25561 Pain in right knee: Principal | ICD-10-CM

## 2018-09-10 DIAGNOSIS — M25661 Stiffness of right knee, not elsewhere classified: Secondary | ICD-10-CM

## 2018-09-10 DIAGNOSIS — G8929 Other chronic pain: Secondary | ICD-10-CM

## 2018-09-10 DIAGNOSIS — M6281 Muscle weakness (generalized): Secondary | ICD-10-CM

## 2018-09-10 NOTE — Therapy (Addendum)
[facility] [place], [place], [address] Phone: [phone]   Fax:  [phone]  Physical Therapy Treatment/Discharge  Patient Details  Name: Lisa Schneider MRN: [id number] Date of Birth: 06/16/1976 Referring Provider: Particia Nearing, PA-C   Encounter Date: 09/10/2018  PT End of Session - 09/10/18 0736    Visit Number  8    Number of Visits  8    Date for PT Re-Evaluation  09/09/18    PT Start Time  0733    PT Stop Time  0827    PT Time Calculation (min)  54 min    Activity Tolerance  Patient tolerated treatment well    Behavior During Therapy  [facility] for tasks assessed/performed       Past Medical History:  Diagnosis Date  . Vitamin D deficiency     Past Surgical History:  Procedure Laterality Date  . WISDOM TOOTH EXTRACTION      There were no vitals filed for this visit.  Subjective Assessment - 09/10/18 0734    Subjective  Patient arrived with overall improvement and ready to DC today, no pain upon arrival    Limitations  Sitting;House hold activities    Diagnostic tests  none    Patient Stated Goals  return to fitness classes like spin/cycling    Currently in Pain?  No/denies         [facility] PT Assessment - 09/10/18 0001      AROM   AROM Assessment Site  Knee    Right/Left Knee  Right    Right Knee Extension  0      Strength   Strength Assessment Site  Knee;Hip    Right/Left Hip  Right    Right Hip Flexion  5/5    Right Hip Extension  5/5    Right/Left Knee  Right    Right Knee Flexion  5/5    Right Knee Extension  5/5                   [facility] Adult PT Treatment/Exercise - 09/10/18 0001      Knee/Hip Exercises: Aerobic   Nustep  15 min L5 UE/LE      Knee/Hip Exercises: Standing   Terminal Knee Extension  Strengthening;Right;20 reps;2 sets    Theraband Level (Terminal Knee Extension)  Other (comment)    Terminal Knee Extension Limitations  pink XTS     Lateral Step Up  20  reps;Right;10 reps;Step Height: 8"    Forward Step Up  20 reps;Right;10 reps;Step Height: 8"    Rocker Board  4 minutes    Other Standing Knee Exercises  lateral stepping with red Tband in tiled hall way x4 laps      Knee/Hip Exercises: Seated   Long Arc Quad  [employer] reps;Strengthening;Right;4 sets    [employer] Weight  4 lbs.      Knee/Hip Exercises: Supine   Bridges with Clamshell  Strengthening;Both;20 reps;Other (comment);Limitations;10 reps   red t-band   Straight Leg Raises  Strengthening;Both;10 reps;1 set   with hip bridge     Vasopneumatic   Number Minutes Vasopneumatic   15 minutes    Vasopnuematic Location   Knee    Vasopneumatic Pressure  Medium                  PT Long Term Goals - 09/10/18 0810      PT LONG TERM GOAL #1   Title  Patient will be independent  with HEP and progression    Time  4    Period  Weeks    Status  Achieved      PT LONG TERM GOAL #2   Title  Patient will demonstrate 0 degrees of right knee flexion to improve gait.    Time  4    Period  Weeks    Status  Achieved      PT LONG TERM GOAL #3   Title  Patient will demonstrate 5/5 right knee MMT in all planes to improve knee stability during functional tasks    Time  4    Period  Weeks    Status  Achieved      PT LONG TERM GOAL #4   Title  Patient will demonstrate 4+/5 or greater right hip abduction MMT to improve knee stability during functional tasks.    Time  4    Period  Weeks    Status  Achieved      PT LONG TERM GOAL #5   Title  Patient will return to group fitness classes and exercising with less than 2/10 pain in right lateral knee.    Time  4    Period  Weeks    Status  Achieved            Plan - 09/10/18 0827    Clinical Impression Statement  Patient has met all current goals. DC to HEP today.    Rehab Potential  Excellent    PT Frequency  2x / week    PT Duration  4 weeks    PT Treatment/Interventions  ADLs/Self Care Home Management;Electrical  Stimulation;Cryotherapy;Iontophoresis 14m/ml Dexamethasone;Moist Heat;Ultrasound;Therapeutic exercise;Therapeutic activities;Gait training;Stair training;Neuromuscular re-education;Passive range of motion;Manual techniques;Patient/family education;Taping;Vasopneumatic Device    PT Next Visit Plan  DC    Consulted and Agree with Plan of Care  Patient       Patient will benefit from skilled therapeutic intervention in order to improve the following deficits and impairments:  Pain, Decreased activity tolerance, Decreased strength, Decreased range of motion, Difficulty walking  Visit Diagnosis: Chronic pain of right knee  Stiffness of right knee, not elsewhere classified  Muscle weakness (generalized)     Problem List Patient Active Problem List   Diagnosis Date Noted  . Vitamin D deficiency 06/28/2017  . Allergic rhinitis due to pollen 04/03/2017  . Fatigue 12/28/2016  . Generalized anxiety disorder 12/28/2016  . Cold intolerance 12/28/2016  . Hair loss 12/28/2016  . Cough 12/28/2016   PHYSICAL THERAPY DISCHARGE SUMMARY  Visits from Start of Care: 8  Current functional level related to goals / functional outcomes: See above   Remaining deficits: All goals met   Education / Equipment: HEP  Plan: Patient agrees to discharge.  Patient goals were met. Patient is being discharged due to meeting the stated rehab goals.  ?????    KGabriela Eves PT, DPT   CLadean Raya PDelaware09/25/19 8:36 AM  [facility] [address] [address]Phone: [phone]  Fax:  [phone] Name: Lisa FRIEDHOFFMRN: [id number] of Birth: [birth year]

## 2018-10-24 DIAGNOSIS — N920 Excessive and frequent menstruation with regular cycle: Secondary | ICD-10-CM | POA: Diagnosis not present

## 2018-10-24 DIAGNOSIS — R5383 Other fatigue: Secondary | ICD-10-CM | POA: Diagnosis not present

## 2018-11-05 ENCOUNTER — Telehealth: Payer: Self-pay | Admitting: Physician Assistant

## 2019-01-23 DIAGNOSIS — Z1239 Encounter for other screening for malignant neoplasm of breast: Secondary | ICD-10-CM | POA: Diagnosis not present

## 2019-01-23 DIAGNOSIS — Z1231 Encounter for screening mammogram for malignant neoplasm of breast: Secondary | ICD-10-CM | POA: Diagnosis not present

## 2019-04-10 ENCOUNTER — Other Ambulatory Visit: Payer: Self-pay | Admitting: Physician Assistant

## 2019-04-21 ENCOUNTER — Encounter: Payer: Self-pay | Admitting: Physician Assistant

## 2019-04-21 ENCOUNTER — Other Ambulatory Visit: Payer: Self-pay | Admitting: Physician Assistant

## 2019-04-21 ENCOUNTER — Ambulatory Visit (INDEPENDENT_AMBULATORY_CARE_PROVIDER_SITE_OTHER): Payer: 59 | Admitting: Physician Assistant

## 2019-04-21 ENCOUNTER — Other Ambulatory Visit: Payer: Self-pay

## 2019-04-21 DIAGNOSIS — F411 Generalized anxiety disorder: Secondary | ICD-10-CM

## 2019-04-21 DIAGNOSIS — J301 Allergic rhinitis due to pollen: Secondary | ICD-10-CM | POA: Diagnosis not present

## 2019-04-21 MED ORDER — LORATADINE 10 MG PO TABS: 10.0000 mg | ORAL_TABLET | Freq: Every day | ORAL | 11 refills | Status: AC

## 2019-04-21 MED ORDER — LORAZEPAM 0.5 MG PO TABS: 0.5000 mg | ORAL_TABLET | Freq: Two times a day (BID) | ORAL | 1 refills | Status: DC | PRN

## 2019-04-21 MED ORDER — VITAMIN D (ERGOCALCIFEROL) 1.25 MG (50000 UNIT) PO CAPS: 50000.0000 [IU] | ORAL_CAPSULE | ORAL | 11 refills | Status: AC

## 2019-04-21 NOTE — Telephone Encounter (Signed)
Denied pt ntbs.

## 2019-04-21 NOTE — Progress Notes (Signed)
Telephone visit  Subjective: KG:[id number] needed PCP: Remus Loffler, PA-C [id number] Lisa Schneider is a 43 y.o. female calls for telephone consult today. Patient provides verbal consent for consult held via phone.  Patient is identified with 2 separate identifiers.  At this time the entire area is on COVID-19 social distancing and stay home orders are in place.  Patient is of higher risk and therefore we are performing this by a virtual method.  Location of patient: home Location of provider: [facility] Others present for call: no  This visit is for periodic recheck on the patient's chronic medical conditions.  She does have chronic anxiety that she takes an occasional Lorazepam for she had had number 60 tablets over the past year.  She has been taking her Claritin for allergies and it is controlling things very well.  Therefore she is not taking any Flonase.  She is also taking vitamin D when she was not able to get it she took an over-the-counter version.  We will send these medications again.  She still goes for her replacement exams with gynecology.   ROS: Per HPI  Allergies  Allergen Reactions  . Amoxicillin Other (See Comments)    Headache  . Ciprofloxacin   . Penicillins    Past Medical History:  Diagnosis Date  . Vitamin D deficiency     Current Outpatient Medications:  .  loratadine (CLARITIN) 10 MG tablet, Take 1 tablet (10 mg total) by mouth daily., Disp: 30 tablet, Rfl: 11 .  LORazepam (ATIVAN) 0.5 MG tablet, Take 1 tablet (0.5 mg total) by mouth 2 (two) times daily as needed for anxiety., Disp: 60 tablet, Rfl: 1 .  meclizine (ANTIVERT) 25 MG tablet, Take 1 tablet (25 mg total) by mouth 3 (three) times daily as needed for dizziness., Disp: 40 tablet, Rfl: 0 .  Vitamin D, Ergocalciferol, (DRISDOL) 1.25 MG (50000 UT) CAPS capsule, Take 1 capsule (50,000 Units total) by mouth every 7 (seven) days., Disp: 4 capsule, Rfl: 11  Assessment/ Plan: 43 y.o. female   1. Allergic  rhinitis due to pollen, unspecified seasonality - loratadine (CLARITIN) 10 MG tablet; Take 1 tablet (10 mg total) by mouth daily.  Dispense: 30 tablet; Refill: 11  2. Generalized anxiety disorder - LORazepam (ATIVAN) 0.5 MG tablet; Take 1 tablet (0.5 mg total) by mouth 2 (two) times daily as needed for anxiety.  Dispense: 60 tablet; Refill: 1   Start time: 12:17 PM End time: 12:24 PM  Meds ordered this encounter  Medications  . loratadine (CLARITIN) 10 MG tablet    Sig: Take 1 tablet (10 mg total) by mouth daily.    Dispense:  30 tablet    Refill:  11    Order Specific Question:   Supervising Provider    Answer:   Raliegh Ip [[id number]]  . Vitamin D, Ergocalciferol, (DRISDOL) 1.25 MG (50000 UT) CAPS capsule    Sig: Take 1 capsule (50,000 Units total) by mouth every 7 (seven) days.    Dispense:  4 capsule    Refill:  11    Order Specific Question:   Supervising Provider    Answer:   Raliegh Ip [[id number]]  . LORazepam (ATIVAN) 0.5 MG tablet    Sig: Take 1 tablet (0.5 mg total) by mouth 2 (two) times daily as needed for anxiety.    Dispense:  60 tablet    Refill:  1    Order Specific Question:   Supervising Provider  Answer:   Janora Norlander [[id number]]    Particia Nearing PA-C [place] [phone]

## 2019-06-25 ENCOUNTER — Ambulatory Visit (INDEPENDENT_AMBULATORY_CARE_PROVIDER_SITE_OTHER): Payer: 59 | Admitting: Family Medicine

## 2019-06-25 ENCOUNTER — Encounter: Payer: Self-pay | Admitting: Family Medicine

## 2019-06-25 DIAGNOSIS — F411 Generalized anxiety disorder: Secondary | ICD-10-CM | POA: Diagnosis not present

## 2019-06-25 MED ORDER — BUSPIRONE HCL 5 MG PO TABS: 5.0000 mg | ORAL_TABLET | Freq: Two times a day (BID) | ORAL | 2 refills | Status: DC

## 2019-06-25 NOTE — Patient Instructions (Signed)

## 2019-06-25 NOTE — Progress Notes (Signed)
Virtual Visit via Telephone Note  I connected with Lisa AcheLori M Tomey on 06/25/19 at 12:20 AM by telephone and verified that I am speaking with the correct person using two identifiers. Lisa Schneider is currently located at work and nobody is currently with her during visit. The provider, Gwenlyn FudgeBRITNEY F Chabely Norby, FNP is located in their home at time of visit.  I discussed the limitations, risks, security and privacy concerns of performing an evaluation and management service by telephone and the availability of in person appointments. I also discussed with the patient that there may be a patient responsible charge related to this service. The patient expressed understanding and agreed to proceed.  Subjective: CC: anxiety PCP: Remus LofflerJones, Angel S, PA-C  Patient reports Saturday when she was out by the pool she got weak and felt like she was going to pass out. She went home and got to feeling a little better, although she did not feel 100%. She started feeling the same way this morning on the way to work. Additional symptoms include sweaty palms, jitteriness, warmth, and a racing heart. Initially she thought it may have been due to heat but this morning she was not hot. She has been drinking Gatorade to stay hydrated, but has not had any today. She is taking iron, B12, vitamin D, and magnesium supplements. She does report increased stress because of "the state of the world". She has Ativan that she takes PRN which has been every 3-4 days here recently. She reports she has tried several medications in the past for long term management of her anxiety but they always caused her to have panic attacks; the ones she could remember are Lexapro, Celexa, and Wellbutrin.   ROS: Per HPI  Allergies  Allergen Reactions  . Amoxicillin Other (See Comments)    Headache  . Ciprofloxacin   . Penicillins    Past Medical History:  Diagnosis Date  . Vitamin D deficiency     Current Outpatient Medications:  .  loratadine  (CLARITIN) 10 MG tablet, Take 1 tablet (10 mg total) by mouth daily., Disp: 30 tablet, Rfl: 11 .  LORazepam (ATIVAN) 0.5 MG tablet, Take 1 tablet (0.5 mg total) by mouth 2 (two) times daily as needed for anxiety., Disp: 60 tablet, Rfl: 1 .  meclizine (ANTIVERT) 25 MG tablet, Take 1 tablet (25 mg total) by mouth 3 (three) times daily as needed for dizziness., Disp: 40 tablet, Rfl: 0 .  Vitamin D, Ergocalciferol, (DRISDOL) 1.25 MG (50000 UT) CAPS capsule, Take 1 capsule (50,000 Units total) by mouth every 7 (seven) days., Disp: 4 capsule, Rfl: 11   Observations/Objective: A&O No respiratory distress heard over the phone Did seem a little anxious on the phone but judgement and thought processes WNL  Assessment and Plan: 1. Generalized anxiety disorder - Encouraged to continue Ativan PRN.  - busPIRone (BUSPAR) 5 MG tablet; Take 1 tablet (5 mg total) by mouth 2 (two) times daily.  Dispense: 60 tablet; Refill: 2 - Patient to let us know if she experiences side effects from the BuSpar that are not tolerable.    Follow Up Instructions: 6 weeks with PCP    I discussed the assessment and treatment plan with the patient. The patient was provided an opportunity to ask questions and all were answered. The patient agreed with the plan and demonstrated an understanding of the instructions.   The patient was advised to call back or seek an in-person evaluation if the symptoms worsen or if the  condition fails to improve as anticipated.  The above assessment and management plan was discussed with the patient. The patient verbalized understanding of and has agreed to the management plan. Patient is aware to call the clinic if symptoms persist or worsen. Patient is aware when to return to the clinic for a follow-up visit. Patient educated on when it is appropriate to go to the emergency department.   Time call ended: 12:30 PM  I provided 10 minutes of non-face-to-face time during this encounter.  Hendricks Limes, MSN, APRN, FNP-C [facility] 06/25/19

## 2019-06-29 ENCOUNTER — Encounter: Payer: Self-pay | Admitting: Family Medicine

## 2019-06-29 ENCOUNTER — Ambulatory Visit (INDEPENDENT_AMBULATORY_CARE_PROVIDER_SITE_OTHER): Payer: 59 | Admitting: Family Medicine

## 2019-06-29 DIAGNOSIS — E860 Dehydration: Secondary | ICD-10-CM

## 2019-06-29 NOTE — Progress Notes (Signed)
Virtual Visit via Telephone Note  I connected with Lisa AcheLori M Kewley on 06/29/19 at 7:59 AM by telephone and verified that I am speaking with the correct person using two identifiers. Lisa Schneider is currently located at home and nobody is currently with her during this visit. The provider, Gwenlyn FudgeBRITNEY F JOYCE, FNP is located in their home at time of visit.  I discussed the limitations, risks, security and privacy concerns of performing an evaluation and management service by telephone and the availability of in person appointments. I also discussed with the patient that there may be a patient responsible charge related to this service. The patient expressed understanding and agreed to proceed.  Subjective: PCP: Remus LofflerJones, Angel S, PA-C  Chief Complaint  Patient presents with  . Diarrhea   Patient reports she just does not feel like herself, that she feels dehydrated. She does not feel this is her normal anxiety. She is drinking plenty of fluids but just feels like her energy is not there. States she has been having diarrhea since March due to increased anxiety. Feels she is not eating what she normally eats and has therefore lost 20 lbs. She was outside in the heat this weekend and feels it coupled with the diarrhea has made her dehydrated. She was looking to get an in person appointment today so that she could have lab work completed and receive IV fluids. Our office does not schedule patients for face to face visits during this current pandemic with her symptom of diarrhea.   ROS: Per HPI  Allergies  Allergen Reactions  . Amoxicillin Other (See Comments)    Headache  . Ciprofloxacin   . Penicillins    Past Medical History:  Diagnosis Date  . Vitamin D deficiency     Current Outpatient Medications:  .  busPIRone (BUSPAR) 5 MG tablet, Take 1 tablet (5 mg total) by mouth 2 (two) times daily., Disp: 60 tablet, Rfl: 2 .  loratadine (CLARITIN) 10 MG tablet, Take 1 tablet (10 mg total) by mouth  daily., Disp: 30 tablet, Rfl: 11 .  LORazepam (ATIVAN) 0.5 MG tablet, Take 1 tablet (0.5 mg total) by mouth 2 (two) times daily as needed for anxiety., Disp: 60 tablet, Rfl: 1 .  meclizine (ANTIVERT) 25 MG tablet, Take 1 tablet (25 mg total) by mouth 3 (three) times daily as needed for dizziness., Disp: 40 tablet, Rfl: 0 .  Vitamin D, Ergocalciferol, (DRISDOL) 1.25 MG (50000 UT) CAPS capsule, Take 1 capsule (50,000 Units total) by mouth every 7 (seven) days., Disp: 4 capsule, Rfl: 11   Observations/Objective: A&O  No respiratory distress or wheezing audible over the phone Mood, judgement, and thought processes all WNL; she was upset that she could not be seen face to face.   Assessment and Plan:  1. Dehydration - Discussed that if she felt she truly needed IV fluids she would need to go to the ER. I did verify with the office that she could not be seen since her symptoms had been going on for 4 months, and she could not.  - Encouraged fluids.  - Education provided on dehydration.   Follow Up Instructions:  I discussed the assessment and treatment plan with the patient. The patient was provided an opportunity to ask questions and all were answered. The patient agreed with the plan and demonstrated an understanding of the instructions.   The patient was advised to call back or seek an in-person evaluation if the symptoms worsen or if the  condition fails to improve as anticipated.  The above assessment and management plan was discussed with the patient. The patient verbalized understanding of and has agreed to the management plan. Patient is aware to call the clinic if symptoms persist or worsen. Patient is aware when to return to the clinic for a follow-up visit. Patient educated on when it is appropriate to go to the emergency department.   Time call ended: 8:07  I provided 8 minutes of non-face-to-face time during this encounter.  Hendricks Limes, MSN, APRN, FNP-C [facility] 06/29/19

## 2019-06-29 NOTE — Patient Instructions (Signed)
Dehydration, Adult  Dehydration is when there is not enough fluid or water in your body. This happens when you lose more fluids than you take in. Dehydration can range from mild to very bad. It should be treated right away to keep it from getting very bad. Symptoms of mild dehydration may include:  Thirst.  Dry lips.  Slightly dry mouth.  Dry, warm skin.  Dizziness. Symptoms of moderate dehydration may include:  Very dry mouth.  Muscle cramps.  Dark pee (urine). Pee may be the color of tea.  Your body making less pee.  Your eyes making fewer tears.  Heartbeat that is uneven or faster than normal (palpitations).  Headache.  Light-headedness, especially when you stand up from sitting.  Fainting (syncope). Symptoms of very bad dehydration may include:  Changes in skin, such as: ? Cold and clammy skin. ? Blotchy (mottled) or pale skin. ? Skin that does not quickly return to normal after being lightly pinched and let go (poor skin turgor).  Changes in body fluids, such as: ? Feeling very thirsty. ? Your eyes making fewer tears. ? Not sweating when body temperature is high, such as in hot weather. ? Your body making very little pee.  Changes in vital signs, such as: ? Weak pulse. ? Pulse that is more than 100 beats a minute when you are sitting still. ? Fast breathing. ? Low blood pressure.  Other changes, such as: ? Sunken eyes. ? Cold hands and feet. ? Confusion. ? Lack of energy (lethargy). ? Trouble waking up from sleep. ? Short-term weight loss. ? Unconsciousness. Follow these instructions at home:   If told by your doctor, drink an ORS: ? Make an ORS by using instructions on the package. ? Start by drinking small amounts, about  cup (120 mL) every 5-10 minutes. ? Slowly drink more until you have had the amount that your doctor said to have.  Drink enough clear fluid to keep your pee clear or pale yellow. If you were told to drink an ORS, finish the  ORS first, then start slowly drinking clear fluids. Drink fluids such as: ? Water. Do not drink only water by itself. Doing that can make the salt (sodium) level in your body get too low (hyponatremia). ? Ice chips. ? Fruit juice that you have added water to (diluted). ? Low-calorie sports drinks.  Avoid: ? Alcohol. ? Drinks that have a lot of sugar. These include high-calorie sports drinks, fruit juice that does not have water added, and soda. ? Caffeine. ? Foods that are greasy or have a lot of fat or sugar.  Take over-the-counter and prescription medicines only as told by your doctor.  Do not take salt tablets. Doing that can make the salt level in your body get too high (hypernatremia).  Eat foods that have minerals (electrolytes). Examples include bananas, oranges, potatoes, tomatoes, and spinach.  Keep all follow-up visits as told by your doctor. This is important. Contact a doctor if:  You have belly (abdominal) pain that: ? Gets worse. ? Stays in one area (localizes).  You have a rash.  You have a stiff neck.  You get angry or annoyed more easily than normal (irritability).  You are more sleepy than normal.  You have a harder time waking up than normal.  You feel: ? Weak. ? Dizzy. ? Very thirsty.  You have peed (urinated) only a small amount of very dark pee during 6-8 hours. Get help right away if:  You have   symptoms of very bad dehydration.  You cannot drink fluids without throwing up (vomiting).  Your symptoms get worse with treatment.  You have a fever.  You have a very bad headache.  You are throwing up or having watery poop (diarrhea) and it: ? Gets worse. ? Does not go away.  You have blood or something green (bile) in your throw-up.  You have blood in your poop (stool). This may cause poop to look black and tarry.  You have not peed in 6-8 hours.  You pass out (faint).  Your heart rate when you are sitting still is more than 100 beats a  minute.  You have trouble breathing. This information is not intended to replace advice given to you by your health care provider. Make sure you discuss any questions you have with your health care provider. Document Released: 09/29/2009 Document Revised: 11/15/2017 Document Reviewed: 01/27/2016 Elsevier Patient Education  2020 Elsevier Inc.  

## 2019-08-06 ENCOUNTER — Other Ambulatory Visit: Payer: Self-pay

## 2019-08-06 DIAGNOSIS — Z20822 Contact with and (suspected) exposure to covid-19: Secondary | ICD-10-CM

## 2019-08-07 LAB — NOVEL CORONAVIRUS, NAA: SARS-CoV-2, NAA: NOT DETECTED

## 2019-09-07 ENCOUNTER — Other Ambulatory Visit: Payer: Self-pay | Admitting: Physician Assistant

## 2019-09-07 DIAGNOSIS — F411 Generalized anxiety disorder: Secondary | ICD-10-CM

## 2019-09-07 NOTE — Telephone Encounter (Signed)
Seen <6 mos ago for anxiety = can you refill?

## 2019-09-21 ENCOUNTER — Encounter: Payer: Self-pay | Admitting: Physician Assistant

## 2019-10-16 ENCOUNTER — Other Ambulatory Visit: Payer: Self-pay

## 2019-10-16 DIAGNOSIS — Z20822 Contact with and (suspected) exposure to covid-19: Secondary | ICD-10-CM

## 2019-10-17 LAB — NOVEL CORONAVIRUS, NAA: SARS-CoV-2, NAA: NOT DETECTED

## 2019-10-21 ENCOUNTER — Ambulatory Visit (INDEPENDENT_AMBULATORY_CARE_PROVIDER_SITE_OTHER): Payer: 59 | Admitting: Physician Assistant

## 2019-10-21 ENCOUNTER — Encounter: Payer: Self-pay | Admitting: Physician Assistant

## 2019-10-21 DIAGNOSIS — R030 Elevated blood-pressure reading, without diagnosis of hypertension: Secondary | ICD-10-CM | POA: Diagnosis not present

## 2019-10-24 ENCOUNTER — Other Ambulatory Visit: Payer: Self-pay

## 2019-10-24 ENCOUNTER — Emergency Department (HOSPITAL_BASED_OUTPATIENT_CLINIC_OR_DEPARTMENT_OTHER)
Admission: EM | Admit: 2019-10-24 | Discharge: 2019-10-24 | Disposition: A | Discharge status: Home / Self Care | Payer: 59 | Attending: Emergency Medicine | Admitting: Emergency Medicine

## 2019-10-24 ENCOUNTER — Encounter (HOSPITAL_BASED_OUTPATIENT_CLINIC_OR_DEPARTMENT_OTHER): Payer: Self-pay | Admitting: Emergency Medicine

## 2019-10-24 ENCOUNTER — Emergency Department (HOSPITAL_BASED_OUTPATIENT_CLINIC_OR_DEPARTMENT_OTHER): Payer: 59

## 2019-10-24 DIAGNOSIS — Z880 Allergy status to penicillin: Secondary | ICD-10-CM | POA: Diagnosis not present

## 2019-10-24 DIAGNOSIS — Z79899 Other long term (current) drug therapy: Secondary | ICD-10-CM | POA: Insufficient documentation

## 2019-10-24 DIAGNOSIS — Z881 Allergy status to other antibiotic agents status: Secondary | ICD-10-CM | POA: Diagnosis not present

## 2019-10-24 DIAGNOSIS — R0789 Other chest pain: Secondary | ICD-10-CM | POA: Diagnosis present

## 2019-10-24 HISTORY — DX: Anxiety disorder, unspecified: F41.9

## 2019-10-24 LAB — BASIC METABOLIC PANEL
Anion gap: 8 (ref 5–15)
BUN: 12 mg/dL (ref 6–20)
CO2: 23 mmol/L (ref 22–32)
Calcium: 9.2 mg/dL (ref 8.9–10.3)
Chloride: 107 mmol/L (ref 98–111)
Creatinine, Ser: 0.74 mg/dL (ref 0.44–1.00)
GFR calc Af Amer: >60 mL/min (ref >60)
GFR calc non Af Amer: >60 mL/min (ref >60)
Glucose, Bld: 113 mg/dL — ABNORMAL HIGH (ref 70–99)
Potassium: 3.4 mmol/L — ABNORMAL LOW (ref 3.5–5.1)
Sodium: 138 mmol/L (ref 135–145)

## 2019-10-24 LAB — CBC WITH DIFFERENTIAL/PLATELET
Abs Immature Granulocytes: 0.02 10*3/uL (ref 0.00–0.07)
Basophils Absolute: 0 10*3/uL (ref 0.0–0.1)
Basophils Relative: 1 %
Eosinophils Absolute: 0.2 10*3/uL (ref 0.0–0.5)
Eosinophils Relative: 3 %
HCT: 39.9 % (ref 36.0–46.0)
Hemoglobin: 13.3 g/dL (ref 12.0–15.0)
Immature Granulocytes: 0 %
Lymphocytes Relative: 20 %
Lymphs Abs: 1.5 10*3/uL (ref 0.7–4.0)
MCH: 29.8 pg (ref 26.0–34.0)
MCHC: 33.3 g/dL (ref 30.0–36.0)
MCV: 89.3 fL (ref 80.0–100.0)
Monocytes Absolute: 0.5 10*3/uL (ref 0.1–1.0)
Monocytes Relative: 6 %
Neutro Abs: 5.1 10*3/uL (ref 1.7–7.7)
Neutrophils Relative %: 70 %
Platelets: 352 10*3/uL (ref 150–400)
RBC: 4.47 MIL/uL (ref 3.87–5.11)
RDW: 12.3 % (ref 11.5–15.5)
WBC: 7.3 10*3/uL (ref 4.0–10.5)
nRBC: 0 % (ref 0.0–0.2)

## 2019-10-24 LAB — TROPONIN I (HIGH SENSITIVITY): Troponin I (High Sensitivity): <2 ng/L (ref <18)

## 2019-10-24 LAB — HCG, QUANTITATIVE, PREGNANCY: hCG, Beta Chain, Quant, S: <1 m[IU]/mL (ref <5)

## 2019-10-24 MED ORDER — KETOROLAC TROMETHAMINE 30 MG/ML IJ SOLN
30.0000 mg | Freq: Once | INTRAMUSCULAR | Status: DC
  Filled 2019-10-24: qty 1

## 2019-10-24 NOTE — ED Provider Notes (Addendum)
[facility] EMERGENCY DEPARTMENT Provider Note   CSN: [id number] Arrival date & time: 10/24/19  4098     History   Chief Complaint Chief Complaint  Patient presents with  . Chest Pain    anxiety    HPI Lisa Schneider is a 43 y.o. female with a past medical history of anxiety presents to ED with a chief complaint of left-sided chest pain.  Reports stabbing pain that has been intermittent with no specific aggravating or alleviating factor for the past 10 days.  She has had chest pain although not similar to this, related to her anxiety in the past.  She was seen and evaluated by her PCP 2 days ago for the symptoms.  Patient was recently started on BuSpar in an attempt to wean her off of her Ativan.  States that since July 2020, she has had to take 0.5-1mg  of Ativan daily. Overall she has been on Ativan for the past 37yrs. states that the Ativan will help with her anxiety but not the chest pain.  She does admit to increased stressors in her life including her job and home.  She denies history of CAD, MI, PE, leg swelling, recent immobilization, chest pain, fever, cough, abdominal pain, nausea or vomiting.  She denies any alcohol, tobacco or other drug use.     HPI  Past Medical History:  Diagnosis Date  . Anxiety   . Vitamin D deficiency     Patient Active Problem List   Diagnosis Date Noted  . Vitamin D deficiency 06/28/2017  . Allergic rhinitis due to pollen 04/03/2017  . Generalized anxiety disorder 12/28/2016  . Cold intolerance 12/28/2016  . Hair loss 12/28/2016    Past Surgical History:  Procedure Laterality Date  . WISDOM TOOTH EXTRACTION       OB History   No obstetric history on file.      Home Medications    Prior to Admission medications   Medication Sig Start Date End Date Taking? Authorizing Provider  busPIRone (BUSPAR) 5 MG tablet Take 1 tablet (5 mg total) by mouth 2 (two) times daily. 06/25/19  Yes Hendricks Limes F, FNP  LORazepam (ATIVAN) 0.5  MG tablet TAKE (1) TABLET TWICE A DAY. 09/07/19  Yes Terald Sleeper, PA-C  loratadine (CLARITIN) 10 MG tablet Take 1 tablet (10 mg total) by mouth daily. 04/21/19   Terald Sleeper, PA-C  meclizine (ANTIVERT) 25 MG tablet Take 1 tablet (25 mg total) by mouth 3 (three) times daily as needed for dizziness. 05/19/18   Terald Sleeper, PA-C  Vitamin D, Ergocalciferol, (DRISDOL) 1.25 MG (50000 UT) CAPS capsule Take 1 capsule (50,000 Units total) by mouth every 7 (seven) days. 04/21/19   Terald Sleeper, PA-C    Family History Family History  Problem Relation Age of Onset  . Thyroid disease Mother   . Thyroid disease Father   . Hyperlipidemia Father   . Hypertension Father   . Heart disease Father   . Cancer Father        Prostate     Social History Social History   Tobacco Use  . Smoking status: Never Smoker  . Smokeless tobacco: Never Used  Substance Use Topics  . Alcohol use: No    Comment: social   . Drug use: No     Allergies   Amoxicillin, Ciprofloxacin, and Penicillins   Review of Systems Review of Systems  Constitutional: Negative for appetite change, chills and fever.  HENT: Negative for  ear pain, rhinorrhea, sneezing and sore throat.   Eyes: Negative for photophobia and visual disturbance.  Respiratory: Negative for cough, chest tightness, shortness of breath and wheezing.   Cardiovascular: Positive for chest pain. Negative for palpitations.  Gastrointestinal: Negative for abdominal pain, blood in stool, constipation, diarrhea, nausea and vomiting.  Genitourinary: Negative for dysuria, hematuria and urgency.  Musculoskeletal: Negative for myalgias.  Skin: Negative for rash.  Neurological: Negative for dizziness, weakness and light-headedness.     Physical Exam Updated Vital Signs BP 115/81   Pulse 75   Temp 99.1 F (37.3 C) (Oral)   Resp 14   Ht 5\' 5"  (1.651 m)   Wt 63.3 kg   LMP 10/10/2019   SpO2 100%   BMI 23.21 kg/m   Physical Exam Vitals signs and  nursing note reviewed.  Constitutional:      General: She is not in acute distress.    Appearance: She is well-developed.  HENT:     Head: Normocephalic and atraumatic.     Nose: Nose normal.  Eyes:     General: No scleral icterus.       Left eye: No discharge.     Conjunctiva/sclera: Conjunctivae normal.  Neck:     Musculoskeletal: Normal range of motion and neck supple.  Cardiovascular:     Rate and Rhythm: Normal rate and regular rhythm.     Heart sounds: Normal heart sounds. No murmur. No friction rub. No gallop.   Pulmonary:     Effort: Pulmonary effort is normal. No respiratory distress.     Breath sounds: Normal breath sounds.  Abdominal:     General: Bowel sounds are normal. There is no distension.     Palpations: Abdomen is soft.     Tenderness: There is no abdominal tenderness. There is no guarding.  Musculoskeletal: Normal range of motion.     Comments: No lower extremity edema, erythema or calf tenderness bilaterally.  Skin:    General: Skin is warm and dry.     Findings: No rash.  Neurological:     Mental Status: She is alert.     Motor: No abnormal muscle tone.     Coordination: Coordination normal.      ED Treatments / Results  Labs (all labs ordered are listed, but only abnormal results are displayed) Labs Reviewed  BASIC METABOLIC PANEL - Abnormal; Notable for the following components:      Result Value   Potassium 3.4 (*)    Glucose, Bld 113 (*)    All other components within normal limits  CBC WITH DIFFERENTIAL/PLATELET  HCG, QUANTITATIVE, PREGNANCY  TROPONIN I (HIGH SENSITIVITY)    EKG EKG Interpretation  Date/Time:  Saturday October 24 2019 09:38:23 EST Ventricular Rate:  87 PR Interval:    QRS Duration: 102 QT Interval:  355 QTC Calculation: 427 R Axis:   -8 Text Interpretation: Sinus rhythm Confirmed by 08-28-1979 [phone]) on 10/24/2019 9:49:16 AM   Radiology Dg Chest 2 View  Result Date: 10/24/2019 CLINICAL DATA:  43  year-old female c/o LEFT upper chest pain x 2 weeks. Pt denies other symptoms. Pt states she had a "panic attack" yesterday EXAM: CHEST - 2 VIEW COMPARISON:  None. FINDINGS: The heart size and mediastinal contours are within normal limits. The lungs are clear. No pneumothorax or pleural effusion. The visualized skeletal structures are unremarkable. IMPRESSION: No acute cardiopulmonary process. Electronically Signed   By: 55 M.D.   On: 10/24/2019 10:26    Procedures Procedures (  including critical care time)  Medications Ordered in ED Medications  ketorolac (TORADOL) 30 MG/ML injection 30 mg (30 mg Intramuscular Not Given 10/24/19 1201)     Initial Impression / Assessment and Plan / ED Course  I have reviewed the triage vital signs and the nursing notes.  Pertinent labs & imaging results that were available during my care of the patient were reviewed by me and considered in my medical decision making (see chart for details).        43 year old female presenting to the ED with left-sided chest pain for the past 10 days.  She had chest pain in the past related to anxiety but states that this feels different.  No specific aggravating or alleviating factor.  On exam she is overall well-appearing.  No lower extremity edema, erythema or calf tenderness concerning for DVT.  She denies any shortness of breath, cough or risk factors for CAD.  She is low risk by heart score (0-1) and PERC negative.  EKG shows sinus rhythm.  Chest x-ray is unremarkable.  Lab work including CBC, BMP, hCG and troponin unremarkable.  I feel that due to her low risk of ACS a single troponin is sufficient to rule out.  Suspect related to anxiety vs MSK cause.  We will have her continue home medications, follow-up with PCP.  Patient is hemodynamically stable, in NAD, and able to ambulate in the ED. Evaluation does not show pathology that would require ongoing emergent intervention or inpatient treatment. I  explained the diagnosis to the patient. Pain has been managed and has no complaints prior to discharge. Patient is comfortable with above plan and is stable for discharge at this time. All questions were answered prior to disposition. Strict return precautions for returning to the ED were discussed. Encouraged follow up with PCP.   An After Visit Summary was printed and given to the patient.   Portions of this note were generated with Scientist, clinical (histocompatibility and immunogenetics)Dragon dictation software. Dictation errors may occur despite best attempts at proofreading.   Final Clinical Impressions(s) / ED Diagnoses   Final diagnoses:  Chest wall pain    ED Discharge Orders    None          Dietrich PatesKhatri, Latrelle Bazar, PA-C 10/24/19 1202    Raeford RazorKohut, Stephen, MD 10/24/19 1238

## 2019-10-24 NOTE — Discharge Instructions (Signed)
Continue your home medications as previously prescribed. Return to the ED if you start to have worsening symptoms, trouble breathing or trouble swallowing, shortness of breath, leg swelling.

## 2019-10-24 NOTE — ED Notes (Signed)
ED Provider at bedside. 

## 2019-10-24 NOTE — ED Triage Notes (Signed)
Left sided chest pain x 1 week, comes and goes. Spoke with PMP x 2 days for same . States had a "panic attack" yesterday, x 30 min

## 2019-10-25 NOTE — Progress Notes (Signed)
     Telephone visit  Subjective: BH:ALPFX pain PCP: Terald Sleeper, PA-C TKW:IOXB Lisa Schneider is a 43 y.o. female calls for telephone consult today. Patient provides verbal consent for consult held via phone.  Patient is identified with 2 separate identifiers.  At this time the entire area is on COVID-19 social distancing and stay home orders are in place.  Patient is of higher risk and therefore we are performing this by a virtual method.  Location of patient: home Location of provider: HOME Others present for call: no   This patient is having a visit because of some increased chest pain.  She states that she also has had some slight elevation in her blood pressure.  It has been in the 120s over 90.  Has come back down to normal but she has had a little bit more burning in her chest.  She has been taking her Nexium for 3 or 4 weeks now she states that has not made much difference.  It does hurt in the past when she had had anxiety.  And she does feel like there is a lot of stress going on in her life.  She had been trying to come off of her Ativan altogether.  I have encouraged her to stay on it once a day for couple more weeks and then to continue to titrated down.  She is also starting to build up the BuSpar for chronic anxiety. In addition she is talking to a counselor at this point.  ROS: Per HPI  Allergies  Allergen Reactions  . Amoxicillin Other (See Comments)    Headache  . Ciprofloxacin   . Penicillins    Past Medical History:  Diagnosis Date  . Anxiety   . Vitamin D deficiency     Current Outpatient Medications:  .  busPIRone (BUSPAR) 5 MG tablet, Take 1 tablet (5 mg total) by mouth 2 (two) times daily., Disp: 60 tablet, Rfl: 2 .  loratadine (CLARITIN) 10 MG tablet, Take 1 tablet (10 mg total) by mouth daily., Disp: 30 tablet, Rfl: 11 .  LORazepam (ATIVAN) 0.5 MG tablet, TAKE (1) TABLET TWICE A DAY., Disp: 60 tablet, Rfl: 1 .  meclizine (ANTIVERT) 25 MG tablet, Take 1  tablet (25 mg total) by mouth 3 (three) times daily as needed for dizziness., Disp: 40 tablet, Rfl: 0 .  Vitamin D, Ergocalciferol, (DRISDOL) 1.25 MG (50000 UT) CAPS capsule, Take 1 capsule (50,000 Units total) by mouth every 7 (seven) days., Disp: 4 capsule, Rfl: 11  Assessment/ Plan: 43 y.o. female   1. Elevated BP without diagnosis of hypertension Keep track Rest, Can stay hydrated Continue with current medications   Return if symptoms worsen or fail to improve.  Continue all other maintenance medications as listed above.  Start time: 3:20 PM End time: 3:29 PM  No orders of the defined types were placed in this encounter.   Particia Nearing PA-C [facility] [phone]

## 2019-11-17 ENCOUNTER — Encounter: Payer: Self-pay | Admitting: Physician Assistant

## 2019-11-19 ENCOUNTER — Ambulatory Visit: Payer: 59 | Admitting: Physician Assistant

## 2019-12-24 ENCOUNTER — Encounter: Payer: Self-pay | Admitting: Physician Assistant

## 2019-12-24 ENCOUNTER — Other Ambulatory Visit: Payer: Self-pay | Admitting: Physician Assistant

## 2019-12-24 MED ORDER — FLUTICASONE PROPIONATE 50 MCG/ACT NA SUSP: 2.0000 | Freq: Every day | NASAL | 11 refills | Status: DC

## 2020-07-28 ENCOUNTER — Ambulatory Visit: Payer: 59 | Attending: Internal Medicine

## 2020-07-28 DIAGNOSIS — Z23 Encounter for immunization: Secondary | ICD-10-CM

## 2020-07-28 NOTE — Progress Notes (Signed)
   Covid-19 Vaccination Clinic  Name:  Lisa Schneider    MRN: [id number] DOB: 08/21/1976...........Marland Kitchen  07/28/2020  Lisa Schneider was observed post Covid-19 immunization for 15 minutes without incident. She was provided with Vaccine Information Sheet and instruction to access the V-Safe system.   Lisa Schneider was instructed to call 911 with any severe reactions post vaccine: Marland Kitchen Difficulty breathing  . Swelling of face and throat  . A fast heartbeat  . A bad rash all over body  . Dizziness and weakness   Immunizations Administered    Name Date Dose VIS Date Route   Pfizer COVID-19 Vaccine 07/28/2020  2:23 PM 0.3 mL 02/10/2019 Intramuscular   Manufacturer: [employer], [employer]   Lot: [id number]   NDC: [address]-5

## 2020-08-18 ENCOUNTER — Ambulatory Visit: Payer: 59 | Attending: Internal Medicine

## 2020-08-18 DIAGNOSIS — Z23 Encounter for immunization: Secondary | ICD-10-CM

## 2020-08-18 NOTE — Progress Notes (Signed)
° °  Covid-19 Vaccination Clinic  Name:  Lisa Schneider    MRN: [id number] DOB: May 25, 1976  08/18/2020  [name] was observed post Covid-19 immunization for 15 minutes without incident. She was provided with Vaccine Information Sheet and instruction to access the V-Safe system.   [name] was instructed to call 911 with any severe reactions post vaccine:  Difficulty breathing   Swelling of face and throat   A fast heartbeat   A bad rash all over body   Dizziness and weakness   Immunizations Administered    Name Date Dose VIS Date Route   Pfizer COVID-19 Vaccine 08/18/2020  4:28 PM 0.3 mL 02/10/2019 Intramuscular   Manufacturer: [employer], [employer]   Lot: [id number]   NDC: [address]-5

## 2020-09-01 ENCOUNTER — Other Ambulatory Visit: Payer: Self-pay | Admitting: Critical Care Medicine

## 2020-09-01 ENCOUNTER — Other Ambulatory Visit: Payer: 59

## 2020-09-01 DIAGNOSIS — Z20822 Contact with and (suspected) exposure to covid-19: Secondary | ICD-10-CM

## 2020-09-03 LAB — NOVEL CORONAVIRUS, NAA: SARS-CoV-2, NAA: NOT DETECTED

## 2020-09-03 LAB — SARS-COV-2, NAA 2 DAY TAT

## 2020-09-22 NOTE — Progress Notes (Signed)
Cardiology Office Note   Date:  09/23/2020   ID:  Lisa Schneider, DOB 09-23-1976, MRN [id number]  PCP:  Ladora Daniel, PA-C  Cardiologist:   No primary care provider on file. Referring:  Ladora Daniel, PA-C  No chief complaint on file.     History of Present Illness: Lisa Schneider is a 44 y.o. female who presents for evaluation of chest pain.   She said that since August she has been getting chest discomfort.  This has been a left-sided discomfort.  It is tight.  There is no dizziness.  It is in the mid upper area.  It is 3 out of 10 in intensity.  Its been constant although waxes and wanes in intensity.  Is not been any jaw or arm pain.  She has had no associated shortness of breath, PND or orthopnea.  He has been able to do some activities like walking to her son's soccer game recently without bringing on the symptoms.  However, she stopped exercising.  She has had no prior cardiac work-up or history.  She does not describe palpitations, presyncope or syncope.  She had no weight gain or edema.   Past Medical History:  Diagnosis Date  . Anxiety   . Vitamin D deficiency     Past Surgical History:  Procedure Laterality Date  . WISDOM TOOTH EXTRACTION       Current Outpatient Medications  Medication Sig Dispense Refill  . loratadine (CLARITIN) 10 MG tablet Take 1 tablet (10 mg total) by mouth daily. 30 tablet 11  . LORazepam (ATIVAN) 0.5 MG tablet TAKE (1) TABLET TWICE A DAY. 60 tablet 1  . Vitamin D, Ergocalciferol, (DRISDOL) 1.25 MG (50000 UT) CAPS capsule Take 1 capsule (50,000 Units total) by mouth every 7 (seven) days. 4 capsule 11   No current facility-administered medications for this visit.    Allergies:   Amoxicillin, Ciprofloxacin, Doxycycline, and Penicillins    Social History:  The patient  reports that she has never smoked. She has never used smokeless tobacco. She reports that she does not drink alcohol and does not use drugs.   Family History:  The patient's  family history includes Cancer in her father; Heart disease (age of onset: 79) in her father; Hyperlipidemia in her father; Hypertension in her father; Thyroid disease in her father and mother.    ROS:  Please see the history of present illness.   Otherwise, review of systems are positive for none.   All other systems are reviewed and negative.    PHYSICAL EXAM: VS:  BP 120/76   Pulse 81   Temp (!) 97.3 F (36.3 C)   Ht 5\' 4"  (1.626 m)   Wt 160 lb (72.6 kg)   SpO2 97%   BMI 27.46 kg/m  , BMI Body mass index is 27.46 kg/m. GENERAL:  Well appearing HEENT:  Pupils equal round and reactive, fundi not visualized, oral mucosa unremarkable NECK:  No jugular venous distention, waveform within normal limits, carotid upstroke brisk and symmetric, no bruits, no thyromegaly LYMPHATICS:  No cervical, inguinal adenopathy LUNGS:  Clear to auscultation bilaterally BACK:  No CVA tenderness CHEST:  Unremarkable HEART:  PMI not displaced or sustained,S1 and S2 within normal limits, no S3, no S4, no clicks, no rubs, no murmurs ABD:  Flat, positive bowel sounds normal in frequency in pitch, no bruits, no rebound, no guarding, no midline pulsatile mass, no hepatomegaly, no splenomegaly EXT:  2 plus pulses throughout, no edema, no  cyanosis no clubbing SKIN:  No rashes no nodules NEURO:  Cranial nerves II through XII grossly intact, motor grossly intact throughout PSYCH:  Cognitively intact, oriented to person place and time    EKG:  EKG is ordered today. The ekg ordered today demonstrates sinus rhythm, rate 81, axis within normal limits, intervals within normal limits, no acute ST-T wave changes.   Recent Labs: 10/24/2019: BUN 12; Creatinine, Ser 0.74; Hemoglobin 13.3; Platelets 352; Potassium 3.4; Sodium 138    Lipid Panel No results found for: CHOL, TRIG, HDL, CHOLHDL, VLDL, LDLCALC, LDLDIRECT    Wt Readings from Last 3 Encounters:  09/23/20 160 lb (72.6 kg)  10/24/19 139 lb 8 oz (63.3 kg)   07/22/18 156 lb 6.4 oz (70.9 kg)      Other studies Reviewed: Additional studies/ records that were reviewed today include: Labs. Review of the above records demonstrates:  Please see elsewhere in the note.     ASSESSMENT AND PLAN:  PRECORDIAL CHEST PAIN:   Her chest pain is atypical.  I did review her old EKG and there was a question of right bundle branch block but this was not evident.  There was an RSR prime with a slight leftward axis.  However, it was normal today.  She does have a family history of early heart disease.  I am going to start with a coronary calcium score.  This will inform goals of therapy as well as give [place] a risk prediction.  If this is 0 I would not suggest other testing.  COVID EDUCATION: She has been vaccinated.  Current medicines are reviewed at length with the patient today.  The patient does not have concerns regarding medicines.  The following changes have been made:  no change  Labs/ tests ordered today include:   Orders Placed This Encounter  Procedures  . CT CARDIAC SCORING  . EKG 12-Lead     Disposition:   FU with me as needed     Signed, Rollene Rotunda, MD  09/23/2020 8:43 AM    [facility]

## 2020-09-23 ENCOUNTER — Ambulatory Visit (INDEPENDENT_AMBULATORY_CARE_PROVIDER_SITE_OTHER): Payer: 59 | Admitting: Cardiology

## 2020-09-23 ENCOUNTER — Other Ambulatory Visit: Payer: Self-pay

## 2020-09-23 ENCOUNTER — Encounter: Payer: Self-pay | Admitting: Cardiology

## 2020-09-23 VITALS — BP 120/76 | HR 81 | Temp 97.3°F | Ht 64.0 in | Wt 160.0 lb

## 2020-09-23 DIAGNOSIS — Z8249 Family history of ischemic heart disease and other diseases of the circulatory system: Secondary | ICD-10-CM | POA: Diagnosis not present

## 2020-09-23 DIAGNOSIS — R079 Chest pain, unspecified: Secondary | ICD-10-CM | POA: Diagnosis not present

## 2020-09-23 NOTE — Patient Instructions (Signed)
Medication Instructions:  Your physician recommends that you continue on your current medications as directed. Please refer to the Current Medication list given to you today.  *If you need a refill on your cardiac medications before your next appointment, please call your pharmacy*  Testing/Procedures: Dr. Antoine Poche has ordered a CT coronary calcium score. This test is done at [address]. This is $150 out of pocket.   Coronary CalciumScan A coronary calcium scan is an imaging test used to look for deposits of calcium and other fatty materials (plaques) in the inner lining of the blood vessels of the heart (coronary arteries). These deposits of calcium and plaques can partly clog and narrow the coronary arteries without producing any symptoms or warning signs. This puts a person at risk for a heart attack. This test can detect these deposits before symptoms develop. Tell a health care provider about:  Any allergies you have.  All medicines you are taking, including vitamins, herbs, eye drops, creams, and over-the-counter medicines.  Any problems you or family members have had with anesthetic medicines.  Any blood disorders you have.  Any surgeries you have had.  Any medical conditions you have.  Whether you are pregnant or may be pregnant. What are the risks? Generally, this is a safe procedure. However, problems may occur, including:  Harm to a pregnant woman and her unborn baby. This test involves the use of radiation. Radiation exposure can be dangerous to a pregnant woman and her unborn baby. If you are pregnant, you generally should not have this procedure done.  Slight increase in the risk of cancer. This is because of the radiation involved in the test. What happens before the procedure? No preparation is needed for this procedure. What happens during the procedure?  You will undress and remove any jewelry around your neck or chest.  You will put on a  hospital gown.  Sticky electrodes will be placed on your chest. The electrodes will be connected to an electrocardiogram (ECG) machine to record a tracing of the electrical activity of your heart.  A CT scanner will take pictures of your heart. During this time, you will be asked to lie still and hold your breath for 2-3 seconds while a picture of your heart is being taken. The procedure may vary among health care providers and hospitals. What happens after the procedure?  You can get dressed.  You can return to your normal activities.  It is up to you to get the results of your test. Ask your health care provider, or the department that is doing the test, when your results will be ready. Summary  A coronary calcium scan is an imaging test used to look for deposits of calcium and other fatty materials (plaques) in the inner lining of the blood vessels of the heart (coronary arteries).  Generally, this is a safe procedure. Tell your health care provider if you are pregnant or may be pregnant.  No preparation is needed for this procedure.  A CT scanner will take pictures of your heart.  You can return to your normal activities after the scan is done. This information is not intended to replace advice given to you by your health care provider. Make sure you discuss any questions you have with your health care provider. Document Released: 05/31/2008 Document Revised: 10/22/2016 Document Reviewed: 10/22/2016 Elsevier Interactive Patient Education  2017 [employer].     Follow-Up: At [facility], you and your health needs are  our priority.  As part of our continuing mission to provide you with exceptional heart care, we have created designated Provider Care Teams.  These Care Teams include your primary Cardiologist (physician) and Advanced Practice Providers (APPs -  Physician Assistants and Nurse Practitioners) who all work together to provide you with the care you need, when you need  it.  We recommend signing up for the patient portal called "MyChart".  Sign up information is provided on this After Visit Summary.  MyChart is used to connect with patients for Virtual Visits (Telemedicine).  Patients are able to view lab/test results, encounter notes, upcoming appointments, etc.  Non-urgent messages can be sent to your provider as well.   To learn more about what you can do with MyChart, go to [email or web address].    Your next appointment:   AS NEEDED with Dr. Antoine Poche

## 2020-09-30 ENCOUNTER — Ambulatory Visit (INDEPENDENT_AMBULATORY_CARE_PROVIDER_SITE_OTHER)
Admission: RE | Admit: 2020-09-30 | Discharge: 2020-09-30 | Disposition: A | Discharge status: Home / Self Care | Payer: Self-pay | Source: Ambulatory Visit | Attending: Cardiology | Admitting: Cardiology

## 2020-09-30 ENCOUNTER — Other Ambulatory Visit: Payer: Self-pay

## 2020-09-30 DIAGNOSIS — Z8249 Family history of ischemic heart disease and other diseases of the circulatory system: Secondary | ICD-10-CM

## 2020-09-30 DIAGNOSIS — R079 Chest pain, unspecified: Secondary | ICD-10-CM

## 2021-04-11 IMAGING — CT CT CARDIAC CORONARY ARTERY CALCIUM SCORE
3 series · 14 of 20 positions shown, 15 images · non-contrast
Comparison: None.
COMPARISON: None.

Addendum:
EXAM:
OVER-READ INTERPRETATION  CT CHEST

The following report is an over-read performed by radiologist Dr.
over-read does not include interpretation of cardiac or coronary
anatomy or pathology. The coronary calcium score interpretation by
the cardiologist is attached.
CLINICAL DATA: Risk stratification
Coronary Calcium Score
TECHNIQUE: The patient was scanned on a Siemens Force scanner. Axial
non-contrast 3 mm slices were carried out through the heart. The
data set was analyzed on a dedicated work station and scored using
the Agatson method.

[Series 2: casc 3.0 bv41 2 bestdiast 73 % · axial · 0.34mm/px · z∈[-157,-88]mm · 4 of 39 slices shown, 5 images]
[im 8/39  vessel]
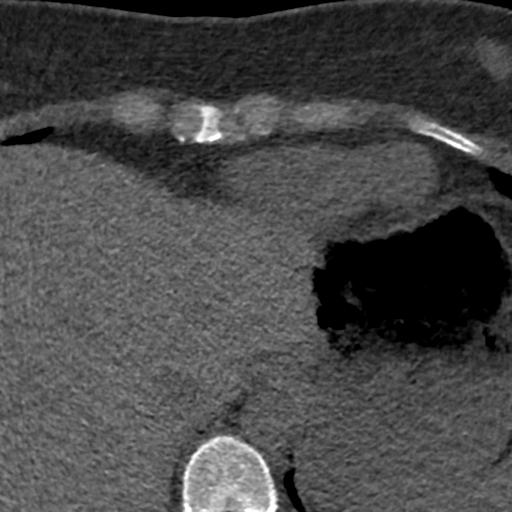
[im 8/39  lung]
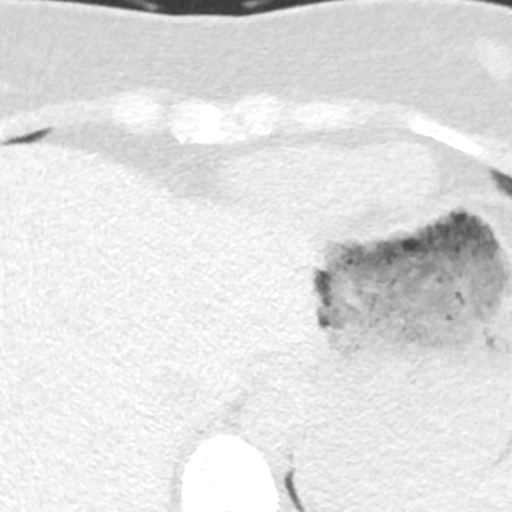
[im 16/39  vessel]
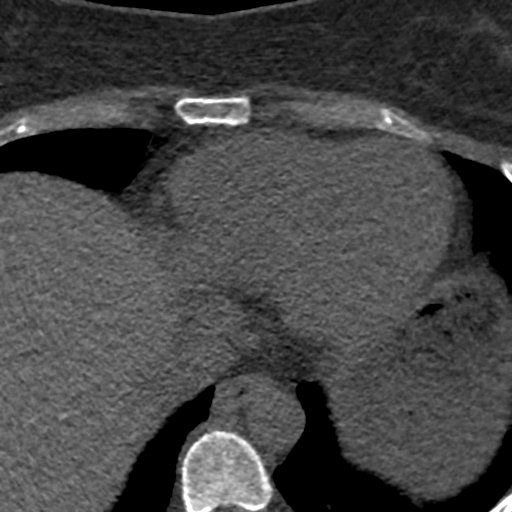
[im 23/39  vessel]
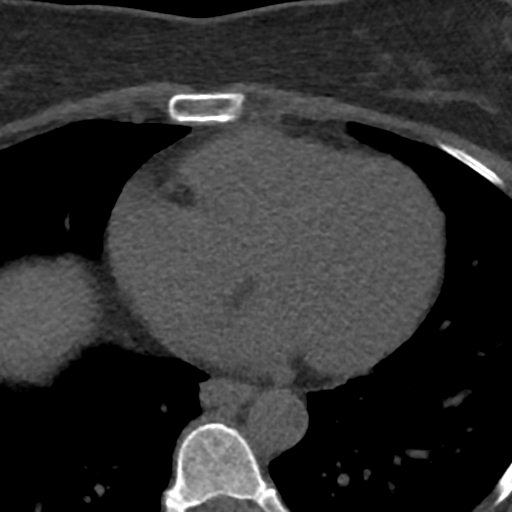
[im 31/39  vessel]
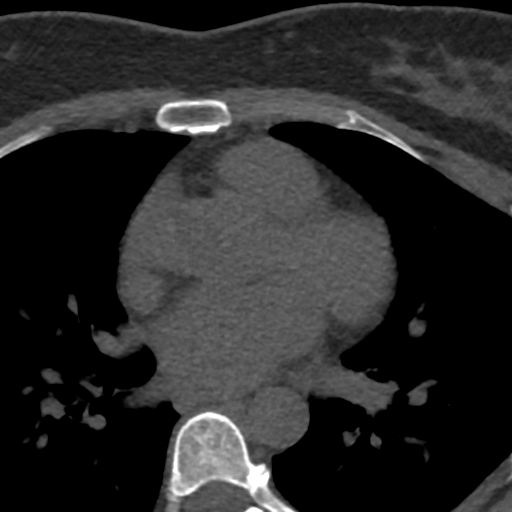

[Series 3: lung 73 % · axial · 0.61mm/px · z∈[-158,-86]mm · 5 of 38 slices shown]
[im 7/38  lung]
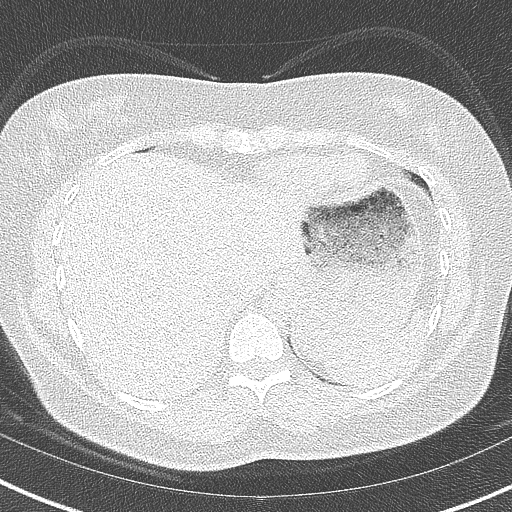
[im 13/38  lung]
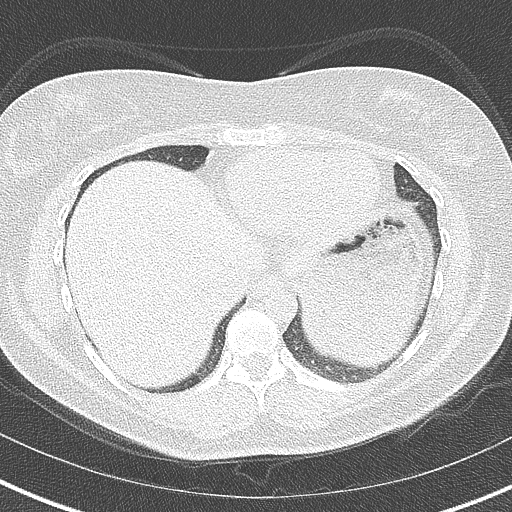
[im 19/38  lung]
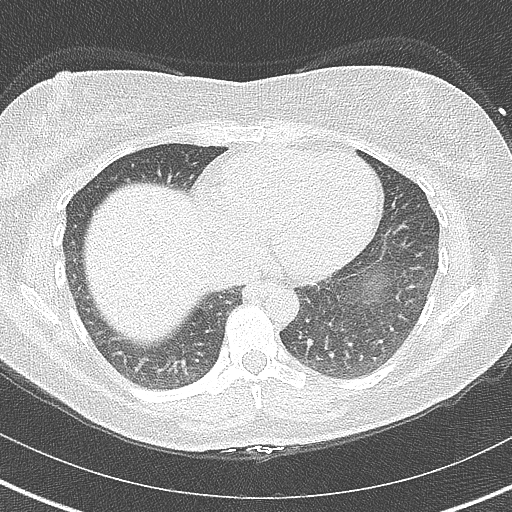
[im 25/38  lung]
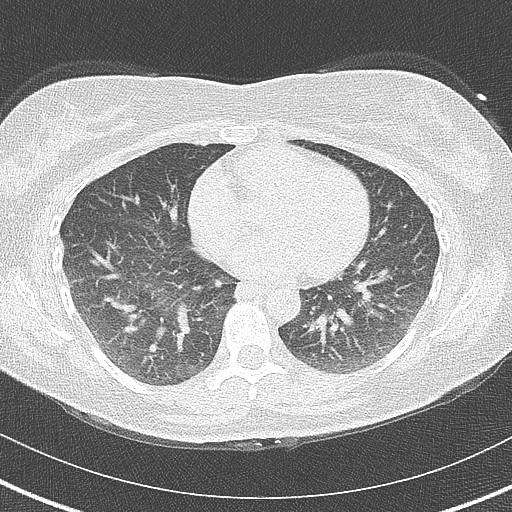
[im 31/38  lung]
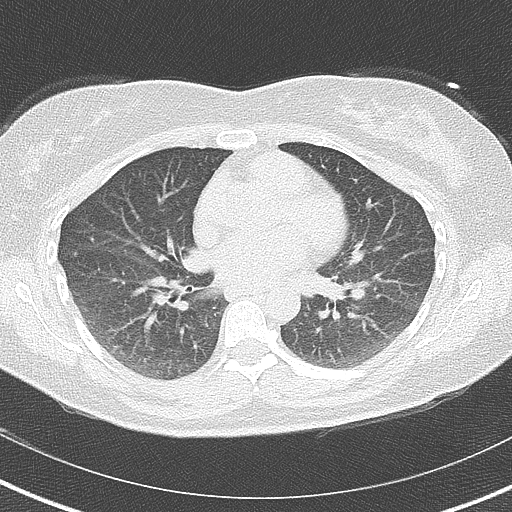

[Series 4: lung st 73 % · axial · 0.61mm/px · z∈[-158,-86]mm · 5 of 38 slices shown]
[im 7/38  lung]
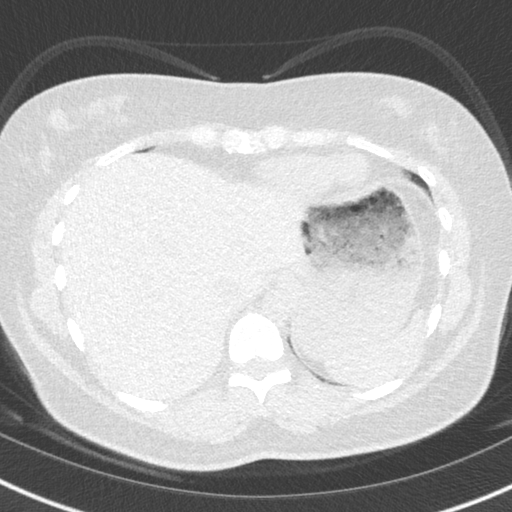
[im 13/38  lung]
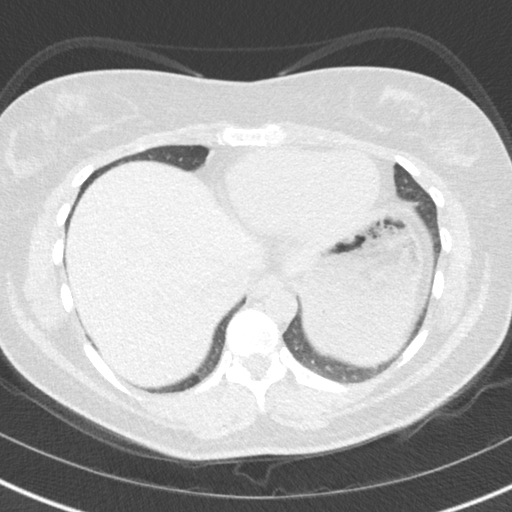
[im 19/38  lung]
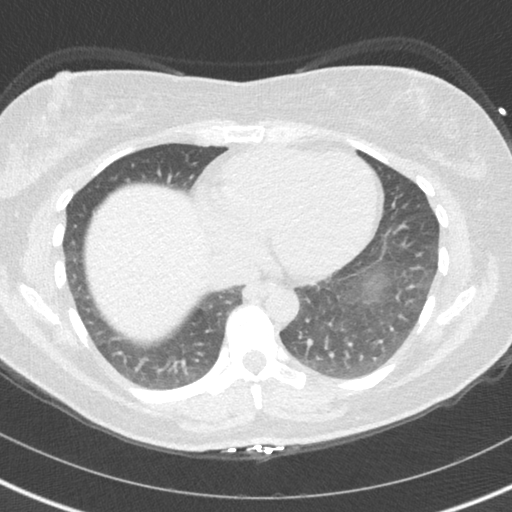
[im 25/38  lung]
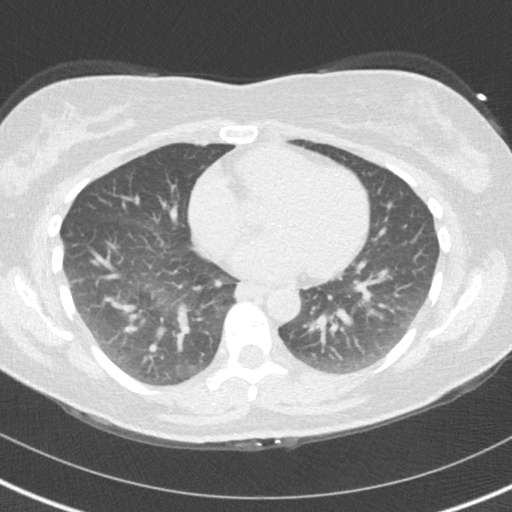
[im 31/38  lung]
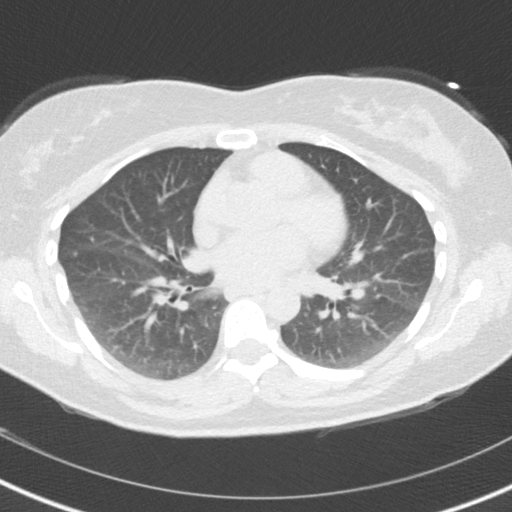

[14 of 20 positions shown; findings below may reference images not displayed]

FINDINGS: Within the visualized portions of the thorax there are no suspicious
appearing pulmonary nodules or masses, there is no acute
consolidative airspace disease, no pleural effusions, no
pneumothorax and no lymphadenopathy. Visualized portions of the
upper abdomen are unremarkable. There are no aggressive appearing
lytic or blastic lesions noted in the visualized portions of the
skeleton.
IMPRESSION: No significant incidental noncardiac findings are noted.
FINDINGS: Non-cardiac: See separate report from [REDACTED].

Ascending Aorta: Normal caliber

Pericardium: Normal

Coronary arteries: Normal origin
IMPRESSION: Coronary calcium score of 0.

*** End of Addendum ***
EXAM:
OVER-READ INTERPRETATION  CT CHEST

The following report is an over-read performed by radiologist Dr.
over-read does not include interpretation of cardiac or coronary
anatomy or pathology. The coronary calcium score interpretation by
the cardiologist is attached.
FINDINGS: Within the visualized portions of the thorax there are no suspicious
appearing pulmonary nodules or masses, there is no acute
consolidative airspace disease, no pleural effusions, no
pneumothorax and no lymphadenopathy. Visualized portions of the
upper abdomen are unremarkable. There are no aggressive appearing
lytic or blastic lesions noted in the visualized portions of the
skeleton.
IMPRESSION: No significant incidental noncardiac findings are noted.

## 2021-08-03 DIAGNOSIS — R072 Precordial pain: Secondary | ICD-10-CM | POA: Insufficient documentation

## 2021-08-03 DIAGNOSIS — R42 Dizziness and giddiness: Secondary | ICD-10-CM | POA: Insufficient documentation

## 2021-08-03 NOTE — Progress Notes (Signed)
Cardiology Office Note   Date:  08/04/2021   ID:  Lisa Schneider, DOB August 29, 1976, MRN [id number]  PCP:  Ladora Daniel, PA-C  Cardiologist:   None Referring:  Ladora Daniel, PA-C  Chief Complaint  Patient presents with   Shortness of Breath       History of Present Illness: Lisa Schneider is a 45 y.o. female who presents for evaluation of chest pain.   At the last visit I ordered a calcium score which was zero.   Since I saw her she has been exercising routinely lifting weights jumping rope and running.  She has episodes of her heart quivering while running.  She stops and goes away.  This has been problematic and symptomatic for her.  She is not describing chest pressure, neck or arm discomfort.  On 2 occasions once after running and once after jumping rope once 3 months ago and once about a month ago she felt her heart skipping and jumping.  She felt like water was being let out of the below.  She did not have any presyncope or syncope.  She has not had any new PND or orthopnea.  She does get short of breath with the episodes more than she thinks she should when her heart is quivering or thumping.   She has not had the symptoms at rest.  She feels anxious.   Past Medical History:  Diagnosis Date   Anxiety    Vitamin D deficiency     Past Surgical History:  Procedure Laterality Date   WISDOM TOOTH EXTRACTION       Current Outpatient Medications  Medication Sig Dispense Refill   loratadine (CLARITIN) 10 MG tablet Take 1 tablet (10 mg total) by mouth daily. 30 tablet 11   LORazepam (ATIVAN) 0.5 MG tablet TAKE (1) TABLET TWICE A DAY. 60 tablet 1   Vitamin D, Ergocalciferol, (DRISDOL) 1.25 MG (50000 UT) CAPS capsule Take 1 capsule (50,000 Units total) by mouth every 7 (seven) days. 4 capsule 11   No current facility-administered medications for this visit.    Allergies:   Amoxicillin, Ciprofloxacin, Doxycycline, and Penicillins    ROS:  Please see the history of present  illness.   Otherwise, review of systems are positive for none.   All other systems are reviewed and negative.    PHYSICAL EXAM: VS:  BP 133/77   Pulse 76   Ht 5\' 4"  (1.626 m)   Wt 171 lb 6.4 oz (77.7 kg)   SpO2 98%   BMI 29.42 kg/m  , BMI Body mass index is 29.42 kg/m. GENERAL:  Well appearing NECK:  No jugular venous distention, waveform within normal limits, carotid upstroke brisk and symmetric, no bruits, no thyromegaly LUNGS:  Clear to auscultation bilaterally CHEST:  Unremarkable HEART:  PMI not displaced or sustained,S1 and S2 within normal limits, no S3, no S4, no clicks, no rubs, no murmurs ABD:  Flat, positive bowel sounds normal in frequency in pitch, no bruits, no rebound, no guarding, no midline pulsatile mass, no hepatomegaly, no splenomegaly EXT:  2 plus pulses throughout, no edema, no cyanosis no clubbing   EKG:  EKG is  ordered today. The ekg ordered today demonstrates sinus rhythm, rate 76, axis within normal limits, intervals within normal limits, no acute ST-T wave changes.   Recent Labs: No results found for requested labs within last 8760 hours.    Lipid Panel No results found for: CHOL, TRIG, HDL, CHOLHDL, VLDL, LDLCALC, LDLDIRECT  Wt Readings from Last 3 Encounters:  08/04/21 171 lb 6.4 oz (77.7 kg)  09/23/20 160 lb (72.6 kg)  10/24/19 139 lb 8 oz (63.3 kg)      Other studies Reviewed: Additional studies/ records that were reviewed today include: None. Review of the above records demonstrates:     ASSESSMENT AND PLAN:   PALPITATIONS: I will apply a 4-week monitor.  Of note she had normal labs to include normal thyroid.  SOB     I will bring the patient back for a POET (Plain Old Exercise Test). This will allow me to screen for obstructive coronary disease, risk stratify and very importantly provide a prescription for exercise.  During a treadmill we will also be able to see if we can capture any unexpected arrhythmias given her feeling of  quivering when she is exercising.    Current medicines are reviewed at length with the patient today.  The patient does not have concerns regarding medicines.  The following changes have been made: None  Labs/ tests ordered today include:   Orders Placed This Encounter  Procedures   Exercise Tolerance Test   Cardiac event monitor      Disposition:   FU with me as needed based on the results of the above   Signed, Rollene Rotunda, MD  08/04/2021 1:31 PM    [facility]

## 2021-08-04 ENCOUNTER — Encounter: Payer: Self-pay | Admitting: Cardiology

## 2021-08-04 ENCOUNTER — Ambulatory Visit (INDEPENDENT_AMBULATORY_CARE_PROVIDER_SITE_OTHER): Payer: BC Managed Care – PPO | Admitting: Cardiology

## 2021-08-04 ENCOUNTER — Other Ambulatory Visit: Payer: Self-pay

## 2021-08-04 VITALS — BP 133/77 | HR 76 | Ht 64.0 in | Wt 171.4 lb

## 2021-08-04 DIAGNOSIS — R0602 Shortness of breath: Secondary | ICD-10-CM | POA: Diagnosis not present

## 2021-08-04 DIAGNOSIS — R42 Dizziness and giddiness: Secondary | ICD-10-CM

## 2021-08-04 DIAGNOSIS — R002 Palpitations: Secondary | ICD-10-CM

## 2021-08-04 DIAGNOSIS — R072 Precordial pain: Secondary | ICD-10-CM

## 2021-08-04 NOTE — Patient Instructions (Signed)
   Testing/Procedures:  Your physician has requested that you have an exercise tolerance test. For further information please visit [email or web address] Please also follow instruction sheet, as given. [facility] OFFICE  Your physician has recommended that you wear a 30 DAY event monitor. Event monitors are medical devices that record the heart's electrical activity. Doctors most often [place] these monitors to diagnose arrhythmias. Arrhythmias are problems with the speed or rhythm of the heartbeat. The monitor is a small, portable device. You can wear one while you do your normal daily activities. This is usually used to diagnose what is causing palpitations/syncope (passing out).    Follow-Up: At [facility], you and your health needs are our priority.  As part of our continuing mission to provide you with exceptional heart care, we have created designated Provider Care Teams.  These Care Teams include your primary Cardiologist (physician) and Advanced Practice Providers (APPs -  Physician Assistants and Nurse Practitioners) who all work together to provide you with the care you need, when you need it.  We recommend signing up for the patient portal called "MyChart".  Sign up information is provided on this After Visit Summary.  MyChart is used to connect with patients for Virtual Visits (Telemedicine).  Patients are able to view lab/test results, encounter notes, upcoming appointments, etc.  Non-urgent messages can be sent to your provider as well.   To learn more about what you can do with MyChart, go to [email or web address].    Your next appointment:    AS NEEDED

## 2021-08-09 ENCOUNTER — Telehealth (HOSPITAL_COMMUNITY): Payer: Self-pay | Admitting: *Deleted

## 2021-08-09 NOTE — Addendum Note (Signed)
Addended by: Felecia Jan on: 08/09/2021 06:55 AM   Modules accepted: Orders

## 2021-08-09 NOTE — Telephone Encounter (Signed)
Close encounter 

## 2021-08-10 ENCOUNTER — Other Ambulatory Visit: Payer: Self-pay

## 2021-08-10 ENCOUNTER — Ambulatory Visit (HOSPITAL_COMMUNITY)
Admission: RE | Admit: 2021-08-10 | Discharge: 2021-08-10 | Disposition: A | Discharge status: Home / Self Care | Payer: BC Managed Care – PPO | Source: Ambulatory Visit | Attending: Cardiology | Admitting: Cardiology

## 2021-08-10 DIAGNOSIS — R072 Precordial pain: Secondary | ICD-10-CM | POA: Insufficient documentation

## 2021-08-10 LAB — EXERCISE TOLERANCE TEST
Base ST Depression (mm): 0 mm
Estimated workload: 11.7
Exercise duration (min): 10 min
Exercise duration (sec): 1 s
MPHR: 175 {beats}/min
Peak HR: 176 {beats}/min
Percent HR: 100 %
Rest HR: 79 {beats}/min
ST Depression (mm): 0 mm

## 2021-08-13 ENCOUNTER — Ambulatory Visit (INDEPENDENT_AMBULATORY_CARE_PROVIDER_SITE_OTHER): Payer: BC Managed Care – PPO

## 2021-08-13 DIAGNOSIS — R42 Dizziness and giddiness: Secondary | ICD-10-CM

## 2021-08-18 ENCOUNTER — Encounter (HOSPITAL_COMMUNITY): Payer: BC Managed Care – PPO

## 2021-09-01 ENCOUNTER — Ambulatory Visit: Payer: 59 | Admitting: Physician Assistant

## 2024-10-19 ENCOUNTER — Other Ambulatory Visit (HOSPITAL_BASED_OUTPATIENT_CLINIC_OR_DEPARTMENT_OTHER): Payer: Self-pay

## 2024-10-19 MED ORDER — FLUZONE 0.5 ML IM SUSY
0.5000 mL | PREFILLED_SYRINGE | Freq: Once | INTRAMUSCULAR | 0 refills | Status: AC
  Filled 2024-10-19: qty 0.5, 1d supply, fill #0
